# Patient Record
Sex: Female | Born: 1990 | Race: Black or African American | Hispanic: No | Marital: Single | Smoking: Current some day smoker
Health system: Southern US, Community
[De-identification: ages and names within clinical notes are randomized; demographics above are authoritative.]

## PROBLEM LIST (undated history)

## (undated) DIAGNOSIS — A64 Unspecified sexually transmitted disease: Secondary | ICD-10-CM

## (undated) DIAGNOSIS — Z789 Other specified health status: Secondary | ICD-10-CM

## (undated) HISTORY — PX: DILATION AND CURETTAGE OF UTERUS: SHX78

---
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
## 2010-04-02 LAB — POCT PREGNANCY, URINE: Preg Test, Ur: NEGATIVE

## 2010-06-05 ENCOUNTER — Inpatient Hospital Stay (HOSPITAL_COMMUNITY)
Admission: RE | Admit: 2010-06-05 | Discharge: 2010-06-05 | Disposition: A | Discharge status: Home / Self Care | Payer: Managed Care, Other (non HMO) | Source: Ambulatory Visit | Attending: Family Medicine | Admitting: Family Medicine

## 2010-09-29 ENCOUNTER — Emergency Department (HOSPITAL_COMMUNITY)
Admission: EM | Admit: 2010-09-29 | Discharge: 2010-09-30 | Discharge status: Against Medical Advice | Payer: Managed Care, Other (non HMO) | Attending: Emergency Medicine | Admitting: Emergency Medicine

## 2010-09-29 DIAGNOSIS — R110 Nausea: Secondary | ICD-10-CM | POA: Insufficient documentation

## 2010-10-20 LAB — POCT INFECTIOUS MONO SCREEN: Mono Screen: POSITIVE — AB

## 2010-10-20 LAB — POCT RAPID STREP A: Streptococcus, Group A Screen (Direct): NEGATIVE

## 2011-11-19 ENCOUNTER — Emergency Department (HOSPITAL_COMMUNITY)
Admission: EM | Admit: 2011-11-19 | Discharge: 2011-11-19 | Disposition: A | Discharge status: Home / Self Care | Payer: Self-pay | Attending: Emergency Medicine | Admitting: Emergency Medicine

## 2011-11-19 ENCOUNTER — Encounter (HOSPITAL_COMMUNITY): Payer: Self-pay | Admitting: Emergency Medicine

## 2011-11-19 ENCOUNTER — Emergency Department (HOSPITAL_COMMUNITY): Payer: Self-pay

## 2011-11-19 ENCOUNTER — Emergency Department (HOSPITAL_COMMUNITY)
Admission: EM | Admit: 2011-11-19 | Discharge: 2011-11-19 | Discharge status: Against Medical Advice | Payer: Self-pay | Attending: Emergency Medicine | Admitting: Emergency Medicine

## 2011-11-19 DIAGNOSIS — F17200 Nicotine dependence, unspecified, uncomplicated: Secondary | ICD-10-CM | POA: Insufficient documentation

## 2011-11-19 DIAGNOSIS — N390 Urinary tract infection, site not specified: Secondary | ICD-10-CM | POA: Insufficient documentation

## 2011-11-19 DIAGNOSIS — A599 Trichomoniasis, unspecified: Secondary | ICD-10-CM | POA: Insufficient documentation

## 2011-11-19 DIAGNOSIS — M79609 Pain in unspecified limb: Secondary | ICD-10-CM | POA: Insufficient documentation

## 2011-11-19 DIAGNOSIS — N898 Other specified noninflammatory disorders of vagina: Secondary | ICD-10-CM | POA: Insufficient documentation

## 2011-11-19 DIAGNOSIS — M549 Dorsalgia, unspecified: Secondary | ICD-10-CM | POA: Insufficient documentation

## 2011-11-19 LAB — CBC WITH DIFFERENTIAL/PLATELET
Basophils Relative: 0 % (ref 0–1)
Eosinophils Absolute: 0.1 10*3/uL (ref 0.0–0.7)
Lymphocytes Relative: 21 % (ref 12–46)
Lymphs Abs: 1.5 10*3/uL (ref 0.7–4.0)
Neutro Abs: 4.9 10*3/uL (ref 1.7–7.7)
Neutrophils Relative %: 68 % (ref 43–77)
Platelets: 196 10*3/uL (ref 150–400)
RBC: 3.9 MIL/uL (ref 3.87–5.11)
RDW: 12.7 % (ref 11.5–15.5)
WBC: 7.2 10*3/uL (ref 4.0–10.5)

## 2011-11-19 LAB — URINE MICROSCOPIC-ADD ON

## 2011-11-19 LAB — COMPREHENSIVE METABOLIC PANEL
Alkaline Phosphatase: 47 U/L (ref 39–117)
BUN: 7 mg/dL (ref 6–23)
Chloride: 101 mEq/L (ref 96–112)
Creatinine, Ser: 0.69 mg/dL (ref 0.50–1.10)
GFR calc Af Amer: >90 mL/min (ref >90)
GFR calc non Af Amer: >90 mL/min (ref >90)
Glucose, Bld: 89 mg/dL (ref 70–99)
Potassium: 4 mEq/L (ref 3.5–5.1)
Total Bilirubin: 0.3 mg/dL (ref 0.3–1.2)

## 2011-11-19 LAB — URINALYSIS, ROUTINE W REFLEX MICROSCOPIC
Bilirubin Urine: NEGATIVE
Glucose, UA: NEGATIVE mg/dL
Ketones, ur: NEGATIVE mg/dL
Nitrite: NEGATIVE
Protein, ur: NEGATIVE mg/dL
pH: 7.5 (ref 5.0–8.0)

## 2011-11-19 MED ORDER — SULFAMETHOXAZOLE-TMP DS 800-160 MG PO TABS
1.0000 | ORAL_TABLET | Freq: Once | ORAL | Status: AC
  Administered 2011-11-19: 1 via ORAL
  Filled 2011-11-19: qty 1

## 2011-11-19 MED ORDER — METRONIDAZOLE 500 MG PO TABS: 500.0000 mg | ORAL_TABLET | Freq: Two times a day (BID) | ORAL | Status: DC

## 2011-11-19 MED ORDER — IOHEXOL 300 MG/ML  SOLN
100.0000 mL | Freq: Once | INTRAMUSCULAR | Status: AC | PRN
  Administered 2011-11-19: 100 mL via INTRAVENOUS

## 2011-11-19 MED ORDER — METRONIDAZOLE 500 MG PO TABS
500.0000 mg | ORAL_TABLET | Freq: Once | ORAL | Status: AC
  Administered 2011-11-19: 500 mg via ORAL
  Filled 2011-11-19: qty 1

## 2011-11-19 MED ORDER — SULFAMETHOXAZOLE-TRIMETHOPRIM 800-160 MG PO TABS: 1.0000 | ORAL_TABLET | Freq: Two times a day (BID) | ORAL | Status: DC

## 2011-11-19 NOTE — ED Provider Notes (Signed)
History     CSN: [id number]  Arrival date & time 11/19/11  4098   First MD Initiated Contact with Patient 11/19/11 2123      Chief Complaint  Patient presents with  . Flank Pain    (Consider location/radiation/quality/duration/timing/severity/associated sxs/prior treatment) Patient is a 21 y.o. female presenting with flank pain. The history is provided by the patient.  Flank Pain This is a new problem. Episode onset: 4 days ago. The problem occurs constantly. The problem has not changed since onset.Associated symptoms include abdominal pain. Pertinent negatives include no chest pain and no shortness of breath. Associated symptoms comments: Pt states she was running and tripped over a stump that caused her to fall and hit her left abd on the tree stump.  Since that time she has had ongoing pain.. The symptoms are aggravated by walking, bending and twisting. Nothing relieves the symptoms. She has tried acetaminophen for the symptoms. The treatment provided no relief.    History reviewed. No pertinent past medical history.  History reviewed. No pertinent past surgical history.  No family history on file.  History  Substance Use Topics  . Smoking status: Current Every Day Smoker  . Smokeless tobacco: Not on file  . Alcohol Use: Yes    OB History    Grav Para Term Preterm Abortions TAB SAB Ect Mult Living                  Review of Systems  Respiratory: Negative for shortness of breath.   Cardiovascular: Negative for chest pain.  Gastrointestinal: Positive for abdominal pain. Negative for nausea, vomiting and diarrhea.  Genitourinary: Positive for flank pain and vaginal bleeding. Negative for dysuria and vaginal discharge.       Mild vaginal spotting starting yesterday  All other systems reviewed and are negative.    Allergies  Review of patient's allergies indicates no known allergies.  Home Medications  No current outpatient prescriptions on file.  BP 117/93   Pulse 71  Temp 98.8 F (37.1 C) (Oral)  SpO2 100%  LMP 11/05/2011  Physical Exam  Nursing note and vitals reviewed. Constitutional: She is oriented to person, place, and time. She appears well-developed and well-nourished. No distress.  HENT:  Head: Normocephalic and atraumatic.  Mouth/Throat: Oropharynx is clear and moist.  Eyes: Conjunctivae normal and EOM are normal. Pupils are equal, round, and reactive to light.  Neck: Normal range of motion. Neck supple.  Cardiovascular: Normal rate, regular rhythm and intact distal pulses.   No murmur heard. Pulmonary/Chest: Effort normal and breath sounds normal. No respiratory distress. She has no wheezes. She has no rales.  Abdominal: Soft. Normal appearance. She exhibits no distension. There is tenderness in the left upper quadrant and left lower quadrant. There is guarding and CVA tenderness. There is no rebound.  Musculoskeletal: Normal range of motion. She exhibits no edema and no tenderness.  Neurological: She is alert and oriented to person, place, and time.  Skin: Skin is warm and dry. No rash noted. No erythema.  Psychiatric: She has a normal mood and affect. Her behavior is normal.    ED Course  Procedures (including critical care time)  Labs Reviewed  URINALYSIS, ROUTINE W REFLEX MICROSCOPIC - Abnormal; Notable for the following:    APPearance CLOUDY (*)     Hgb urine dipstick SMALL (*)     Leukocytes, UA LARGE (*)     All other components within normal limits  URINE MICROSCOPIC-ADD ON - Abnormal; Notable for the  following:    Squamous Epithelial / LPF FEW (*)     Bacteria, UA FEW (*)     All other components within normal limits  PREGNANCY, URINE  COMPREHENSIVE METABOLIC PANEL  CBC WITH DIFFERENTIAL  URINE CULTURE   Ct Abdomen Pelvis W Contrast  11/19/2011  *RADIOLOGY REPORT*  Clinical Data: Left lower back pain and abdominal pain. Recent abdominal injury; assess for splenic laceration.  CT ABDOMEN AND PELVIS WITH  CONTRAST  Technique:  Multidetector CT imaging of the abdomen and pelvis was performed following the standard protocol during bolus administration of intravenous contrast.  Contrast: OMNIPAQUE IOHEXOL 300 MG/ML  SOLN  Comparison: None.  Findings: The visualized lung bases are clear.  The liver and spleen are unremarkable in appearance.  The gallbladder is within normal limits.  The pancreas and adrenal glands are unremarkable.  The kidneys are unremarkable in appearance.  There is no evidence of hydronephrosis.  No renal or ureteral stones are seen.  No perinephric stranding is appreciated.  No free fluid is identified.  The small bowel is unremarkable in appearance.  The stomach is within normal limits.  No acute vascular abnormalities are seen.  The appendix is not well characterized, but there is no evidence for appendicitis.  The colon is unremarkable in appearance.  The bladder is mildly distended and grossly unremarkable in appearance.  The uterus is grossly normal in appearance.  The ovaries are relatively symmetric, though slightly prominent; no suspicious adnexal masses are seen.  A small amount of free fluid within the pelvic cul-de-sac is likely physiologic in nature.  No inguinal lymphadenopathy is seen.  No acute osseous abnormalities are identified.  IMPRESSION: Unremarkable contrast CT of the abdomen and pelvis.   Original Report Authenticated By: Tonia Ghent, M.D.      No diagnosis found.    MDM   The patient states that 4 days ago she was running and tripped over a tree stump and inhale her left side. Since that time she's had severe left upper and lower quadrant tenderness. She denies any dysuria but states she did start having spotting vaginally yesterday. She denies any vaginal discharge or other complaints. She states she is sexually active. Last normal period was 3 weeks ago. On exam patient has significant left upper, left flank and left lower quadrant tenderness. She has  normal blood pressure and denies dizziness, weakness, vomiting, change in bowel movements. Concern for possible injury due to fall in hitting her abdomen. Will get a CT to rule out bleed. UA with trace blood however she states she is bleeding vaginally and this was a clean-catch. Also shown to have Trichomonas in her urine. This will be to be treated. CMP and CBC pending.  10:56 PM Labs wnl except for urine.  CT neg.  Will treat trichomonas with flagyl.  Pt states she has been in a monogomous relationship with a female for the last year and denies any vaginal d/c or pain. UTI treated with bactrim.      Gwyneth Sprout, MD 11/19/11 2314

## 2011-11-19 NOTE — ED Notes (Signed)
Pt is c/o left lower back and abdomen pain starting last Friday. Pt has no c/o fever, nausea or vomiting or dysuria. Pt is resting comfortably at this time and is in no obvious distress.

## 2011-11-19 NOTE — ED Notes (Signed)
Pt c/o left sided lower back pain into hip and down left leg after falling 3 days ago

## 2011-11-19 NOTE — ED Notes (Signed)
Pt states that she has been having L flank pain since Friday. Sexually active. States she has been having vaginal spotting of blood. Period was two weeks ago.

## 2011-11-19 NOTE — ED Notes (Signed)
No answer x 3 per NT and Nurse First 

## 2011-11-21 LAB — URINE CULTURE

## 2012-02-09 ENCOUNTER — Encounter (HOSPITAL_COMMUNITY): Payer: Self-pay | Admitting: *Deleted

## 2012-02-09 ENCOUNTER — Emergency Department (HOSPITAL_COMMUNITY)
Admission: EM | Admit: 2012-02-09 | Discharge: 2012-02-09 | Disposition: A | Discharge status: Home / Self Care | Payer: Self-pay | Attending: Emergency Medicine | Admitting: Emergency Medicine

## 2012-02-09 DIAGNOSIS — R05 Cough: Secondary | ICD-10-CM | POA: Insufficient documentation

## 2012-02-09 DIAGNOSIS — J029 Acute pharyngitis, unspecified: Secondary | ICD-10-CM | POA: Insufficient documentation

## 2012-02-09 DIAGNOSIS — R51 Headache: Secondary | ICD-10-CM | POA: Insufficient documentation

## 2012-02-09 DIAGNOSIS — R0989 Other specified symptoms and signs involving the circulatory and respiratory systems: Secondary | ICD-10-CM | POA: Insufficient documentation

## 2012-02-09 DIAGNOSIS — R059 Cough, unspecified: Secondary | ICD-10-CM | POA: Insufficient documentation

## 2012-02-09 DIAGNOSIS — J3489 Other specified disorders of nose and nasal sinuses: Secondary | ICD-10-CM | POA: Insufficient documentation

## 2012-02-09 DIAGNOSIS — R0609 Other forms of dyspnea: Secondary | ICD-10-CM | POA: Insufficient documentation

## 2012-02-09 DIAGNOSIS — F17200 Nicotine dependence, unspecified, uncomplicated: Secondary | ICD-10-CM | POA: Insufficient documentation

## 2012-02-09 DIAGNOSIS — R6889 Other general symptoms and signs: Secondary | ICD-10-CM

## 2012-02-09 MED ORDER — HYDROCODONE-HOMATROPINE 5-1.5 MG/5ML PO SYRP: 5.0000 mL | ORAL_SOLUTION | Freq: Four times a day (QID) | ORAL | Status: DC | PRN

## 2012-02-09 MED ORDER — IBUPROFEN 400 MG PO TABS
800.0000 mg | ORAL_TABLET | Freq: Once | ORAL | Status: AC
  Administered 2012-02-09: 800 mg via ORAL
  Filled 2012-02-09: qty 2

## 2012-02-09 MED ORDER — NAPROXEN 500 MG PO TABS: 500.0000 mg | ORAL_TABLET | Freq: Two times a day (BID) | ORAL | Status: DC

## 2012-02-09 MED ORDER — MOMETASONE FUROATE 50 MCG/ACT NA SUSP: 2.0000 | Freq: Every day | NASAL | Status: DC

## 2012-02-09 MED ORDER — HYDROCOD POLST-CHLORPHEN POLST 10-8 MG/5ML PO LQCR
5.0000 mL | Freq: Once | ORAL | Status: AC
  Administered 2012-02-09: 5 mL via ORAL
  Filled 2012-02-09: qty 5

## 2012-02-09 NOTE — ED Provider Notes (Signed)
History   This chart was scribed for non-physician practitioner working with Derwood Kaplan, MD by Leone Payor, ED Scribe. This patient was seen in room TR08C/TR08C and the patient's care was started at 1802.   CSN: [id number]  Arrival date & time 02/09/12  1802   First MD Initiated Contact with Patient 02/09/12 1829      Chief Complaint  Patient presents with  . Influenza     The history is provided by the patient. No language interpreter was used.    Kristina Day is a 22 y.o. female who presents to the Emergency Department complaining of new, constant, unchanged nasal congestion with associated facial pain, trouble breathing, cough, sore throat. Pt reports taking dayquil with mild relief but denies taking any decongestants. Pt denies tinnitus.    Pt is a current everyday smoker (2 cigarette/day) and occasional alcohol user.  History reviewed. No pertinent past medical history.  History reviewed. No pertinent past surgical history.  No family history on file.  History  Substance Use Topics  . Smoking status: Current Every Day Smoker  . Smokeless tobacco: Not on file  . Alcohol Use: Yes    No OB history provided.   Review of Systems  A complete 10 system review of systems was obtained and all systems are negative except as noted in the HPI and PMH.   Allergies  Review of patient's allergies indicates no known allergies.  Home Medications   Current Outpatient Rx  Name  Route  Sig  Dispense  Refill  . VICKS DAYQUIL/NYQUIL CLD & FLU PO   Oral   Take 1 capsule by mouth daily as needed. For cold and flu symptoms           BP 119/78  Pulse 85  Temp 98.3 F (36.8 C) (Oral)  Resp 16  SpO2 99%  Physical Exam  Nursing note and vitals reviewed. Constitutional: She is oriented to person, place, and time. She appears well-developed and well-nourished. No distress.  HENT:  Head: Normocephalic and atraumatic.  Mouth/Throat: Oropharynx is clear and moist. No  oropharyngeal exudate.       Mucous membranes moist and clear.   No tenderness to palpation over frontal or maxillary sinuses.   Eyes: Conjunctivae normal and EOM are normal.  Neck: Normal range of motion.  Cardiovascular: Normal rate, regular rhythm and normal heart sounds.   Pulmonary/Chest: Effort normal and breath sounds normal. No respiratory distress. She has no wheezes. She has no rales.       Lungs clear to auscultation bilaterally.   Musculoskeletal: Normal range of motion.  Lymphadenopathy:    She has no cervical adenopathy.  Neurological: She is alert and oriented to person, place, and time.  Skin: Skin is warm and dry. No rash noted. She is not diaphoretic.  Psychiatric: She has a normal mood and affect. Her behavior is normal.    ED Course  Procedures (including critical care time)  DIAGNOSTIC STUDIES: Oxygen Saturation is 99% on room air, normal by my interpretation.    COORDINATION OF CARE:  6:42 PM Will treat symptomatically in ER with prescriptions cough syrup and nasal spray. Pt advised to return to ED If symptoms worsen. Pt will be given school note for 4 days.   Labs Reviewed - No data to display No results found.   No diagnosis found.    MDM  Flu like s/s  Patients symptoms are consistent with URI/flu like symptoms, likely viral etiology. Discussed that antibiotics are not  indicated for viral infections. Pt will be discharged with symptomatic treatment.  Verbalizes understanding and is agreeable with plan. Pt is hemodynamically stable & in NAD prior to dc.      I personally performed the services described in this documentation, which was scribed in my presence. The recorded information has been reviewed and is accurate.       Jaci Carrel, [place] 02/09/12 1939

## 2012-02-09 NOTE — ED Notes (Signed)
Pt woke up this am with body aches, congestion, chills.  No abdominal pain or other symptoms.

## 2012-02-10 NOTE — ED Provider Notes (Signed)
Medical screening examination/treatment/procedure(s) were performed by non-physician practitioner and as supervising physician I was immediately available for consultation/collaboration.  Merwin Breden, MD 02/10/12 1605 

## 2012-06-08 ENCOUNTER — Emergency Department (HOSPITAL_COMMUNITY)
Admission: EM | Admit: 2012-06-08 | Discharge: 2012-06-09 | Disposition: A | Discharge status: Home / Self Care | Payer: Self-pay | Attending: Emergency Medicine | Admitting: Emergency Medicine

## 2012-06-08 DIAGNOSIS — K529 Noninfective gastroenteritis and colitis, unspecified: Secondary | ICD-10-CM

## 2012-06-08 DIAGNOSIS — Z3202 Encounter for pregnancy test, result negative: Secondary | ICD-10-CM | POA: Insufficient documentation

## 2012-06-08 DIAGNOSIS — K5289 Other specified noninfective gastroenteritis and colitis: Secondary | ICD-10-CM | POA: Insufficient documentation

## 2012-06-08 DIAGNOSIS — R509 Fever, unspecified: Secondary | ICD-10-CM

## 2012-06-08 DIAGNOSIS — F17200 Nicotine dependence, unspecified, uncomplicated: Secondary | ICD-10-CM | POA: Insufficient documentation

## 2012-06-08 LAB — COMPREHENSIVE METABOLIC PANEL
ALT: 9 U/L (ref 0–35)
Calcium: 9.4 mg/dL (ref 8.4–10.5)
Creatinine, Ser: 0.75 mg/dL (ref 0.50–1.10)
GFR calc Af Amer: >90 mL/min (ref >90)
Glucose, Bld: 96 mg/dL (ref 70–99)
Sodium: 134 mEq/L — ABNORMAL LOW (ref 135–145)
Total Protein: 7.6 g/dL (ref 6.0–8.3)

## 2012-06-08 LAB — CBC
MCH: 32.6 pg (ref 26.0–34.0)
MCHC: 35.4 g/dL (ref 30.0–36.0)
MCV: 92.1 fL (ref 78.0–100.0)
Platelets: 202 10*3/uL (ref 150–400)

## 2012-06-08 LAB — URINALYSIS, ROUTINE W REFLEX MICROSCOPIC
Nitrite: NEGATIVE
Specific Gravity, Urine: 1.025 (ref 1.005–1.030)
pH: 7.5 (ref 5.0–8.0)

## 2012-06-08 LAB — URINE MICROSCOPIC-ADD ON

## 2012-06-08 LAB — POCT PREGNANCY, URINE: Preg Test, Ur: NEGATIVE

## 2012-06-08 MED ORDER — ACETAMINOPHEN 325 MG PO TABS
650.0000 mg | ORAL_TABLET | Freq: Once | ORAL | Status: AC
  Administered 2012-06-08: 650 mg via ORAL
  Filled 2012-06-08 (×2): qty 1

## 2012-06-08 MED ORDER — SODIUM CHLORIDE 0.9 % IV BOLUS (SEPSIS)
2000.0000 mL | Freq: Once | INTRAVENOUS | Status: AC
  Administered 2012-06-08: 2000 mL via INTRAVENOUS

## 2012-06-08 MED ORDER — IOHEXOL 300 MG/ML  SOLN
50.0000 mL | Freq: Once | INTRAMUSCULAR | Status: AC | PRN
  Administered 2012-06-08: 50 mL via ORAL

## 2012-06-08 NOTE — ED Notes (Signed)
Pt c/o fever, chills, weakness, generalized body aches, and HA. Denies N/V/D.

## 2012-06-08 NOTE — ED Notes (Signed)
Family at bedside. 

## 2012-06-08 NOTE — ED Notes (Addendum)
NURSE FIRST ROUNDS : NURSE EXPLAINED DELAY AND PROCESS , FEVER SUBSIDED , DENIES PAIN / RESPIRATIONS UNLABORED , AMBULATORY . PT. STATED SHE IS FEELING BETTER.  PT. UNABLE TO GIVE URINE SPECIMEN AT THIS TIME.

## 2012-06-08 NOTE — ED Provider Notes (Signed)
History     CSN: [id number]  Arrival date & time 06/08/12  1929   First MD Initiated Contact with Patient 06/08/12 2224      Chief Complaint  Patient presents with  . Fever    (Consider location/radiation/quality/duration/timing/severity/associated sxs/prior treatment) HPI Patient presents to the emergency department with fevers, bodyaches for the last 2 days.  Patient, states, that she's not had any chest pain, shortness of breath, cough, nausea, vomiting, diarrhea, blurred vision, sore throat, runny nose, dizziness, syncope,vaginal discharge, rash or neck pain patient, states, that she has had abdominal pain, that is mostly mid abdominal pain.  Patient, states she is currently on her menstrual cycle.  Patient, states she did not take anything prior to arrival for her symptoms.  Patient, states, that palpation makes her abdominal pain, worse. No past medical history on file.  No past surgical history on file.  No family history on file.  History  Substance Use Topics  . Smoking status: Current Every Day Smoker  . Smokeless tobacco: Not on file  . Alcohol Use: Yes    OB History   Grav Para Term Preterm Abortions TAB SAB Ect Mult Living                  Review of Systems All other systems negative except as documented in the HPI. All pertinent positives and negatives as reviewed in the HPI. Allergies  Review of patient's allergies indicates no known allergies.  Home Medications  No current outpatient prescriptions on file.  BP 125/69  Pulse 81  Temp(Src) 99.4 F (37.4 C) (Oral)  Resp 20  SpO2 100%  Physical Exam  Nursing note and vitals reviewed. Constitutional: She is oriented to person, place, and time. She appears well-developed and well-nourished. No distress.  HENT:  Head: Normocephalic and atraumatic.  Mouth/Throat: Oropharynx is clear and moist.  Eyes: Conjunctivae and EOM are normal. Pupils are equal, round, and reactive to light.  Neck: Normal range  of motion. Neck supple. No rigidity.  Cardiovascular: Normal rate and regular rhythm.  Exam reveals no gallop and no friction rub.   No murmur heard. Pulmonary/Chest: Effort normal and breath sounds normal. No respiratory distress. She has no wheezes. She has no rales.  Abdominal: Soft. Bowel sounds are normal. She exhibits no distension, no fluid wave and no ascites. There is no rigidity, no rebound, no guarding and no CVA tenderness. No hernia.    Lymphadenopathy:    She has no cervical adenopathy.  Neurological: She is alert and oriented to person, place, and time. She exhibits normal muscle tone. Coordination normal.  Skin: Skin is warm and dry. No rash noted. No erythema.    ED Course  Procedures (including critical care time)  Labs Reviewed  CBC - Abnormal; Notable for the following:    WBC 11.2 (*)    All other components within normal limits  COMPREHENSIVE METABOLIC PANEL - Abnormal; Notable for the following:    Sodium 134 (*)    All other components within normal limits  URINALYSIS, ROUTINE W REFLEX MICROSCOPIC  POCT PREGNANCY, URINE   Patient is being evaluated for possible sources of infection.  Patient does not have any cough or URI symptoms.patient denies any dysuria CT scan will be obtained due the fact she has abdominal pain, with fever. patient does not have any pelvic or lower abdominal pain. patient also denies any neck rigidity.patient has findings that may be a mild colitis.  We'll treat the patient with antibiotics. will  have the patient return here as needed.   MDM  MDM Reviewed: nursing note and vitals Interpretation: labs and CT scan           Carlyle Dolly, PA-C 06/09/12 0139

## 2012-06-08 NOTE — ED Notes (Signed)
Patient is resting comfortably. 

## 2012-06-08 NOTE — ED Notes (Signed)
Patient presents with c/o fever, generalized weakness and fatigue x 1 day. Also endorses sweats and chills. No cough, congestion, SOB, CP. No recent illness. No known sick contacts.

## 2012-06-09 ENCOUNTER — Emergency Department (HOSPITAL_COMMUNITY): Payer: Self-pay

## 2012-06-09 MED ORDER — HYDROCODONE-ACETAMINOPHEN 5-325 MG PO TABS: 1.0000 | ORAL_TABLET | Freq: Four times a day (QID) | ORAL | Status: DC | PRN

## 2012-06-09 MED ORDER — KETOROLAC TROMETHAMINE 30 MG/ML IJ SOLN
30.0000 mg | Freq: Once | INTRAMUSCULAR | Status: AC
  Administered 2012-06-09: 30 mg via INTRAVENOUS
  Filled 2012-06-09: qty 1

## 2012-06-09 MED ORDER — METRONIDAZOLE 500 MG PO TABS: 500.0000 mg | ORAL_TABLET | Freq: Two times a day (BID) | ORAL | Status: DC

## 2012-06-09 MED ORDER — CIPROFLOXACIN HCL 500 MG PO TABS: 500.0000 mg | ORAL_TABLET | Freq: Two times a day (BID) | ORAL | Status: DC

## 2012-06-09 MED ORDER — IOHEXOL 300 MG/ML  SOLN
100.0000 mL | Freq: Once | INTRAMUSCULAR | Status: AC | PRN
  Administered 2012-06-09: 100 mL via INTRAVENOUS

## 2012-06-09 NOTE — ED Notes (Signed)
Pt ambulatory to restroom. Fever decraesed 99.8. Second liter bolus infusing. Pt drinking contrast for CT.

## 2012-06-10 LAB — URINE CULTURE: Colony Count: 70000

## 2012-06-10 NOTE — ED Provider Notes (Signed)
Medical screening examination/treatment/procedure(s) were conducted as a shared visit with non-physician practitioner(s) and myself.  I personally evaluated the patient during the encounter.  No acute abdomen.  CT scan reveals mild colitis.  We'll treat as outpatient  Donnetta Hutching, MD 06/10/12 0111

## 2012-06-11 NOTE — ED Notes (Signed)
Post ED Visit - Positive Culture Follow-up: Successful Patient Follow-Up  Culture assessed and recommendations reviewed by: []  Wes Dulaney, Pharm.D., BCPS []  Celedonio Miyamoto, [address].D., BCPS []  Georgina Pillion, Pharm.D., BCPS [x]  [place], [place].D., BCPS, AAHIVP []  Estella Husk, Pharm.D., BCPS, AAHIVP  Positive urine culture  []  Patient discharged without antimicrobial prescription and treatment is now indicated [x]  Organism is resistant to prescribed ED discharge antimicrobial []  Patient with positive blood cultures  Changes discussed with ED provider: Tatayana New antibiotic prescription- Amoxicillin 500 mg TID x 7 days Called to [facility] on [phone]  Contacted patient- no answer, date 5/28, time 1628   Larena Sox 06/11/2012, 4:25 PM

## 2012-06-12 NOTE — Progress Notes (Signed)
ED Antimicrobial Stewardship Positive Culture Follow Up   Kristina Day is an 22 y.o. female who presented to [facility] on 06/08/2012 with a chief complaint of  Chief Complaint  Patient presents with  . Fever    Recent Results (from the past 720 hour(s))  URINE CULTURE     Status: None   Collection Time    06/08/12  9:55 PM      Result Value Range Status   Specimen Description URINE, CLEAN CATCH   Final   Special Requests ADDED 2240   Final   Culture  Setup Time 06/08/2012 22:46   Final   Colony Count 70,000 COLONIES/ML   Final   Culture GROUP A STREP (S.PYOGENES) ISOLATED   Final   Report Status 06/10/2012 FINAL   Final    [x]  Treated with Cipro, organism resistant to prescribed antimicrobial []  Patient discharged originally without antimicrobial agent and treatment is now indicated  New antibiotic prescription: Ceftin 500mg  BID x7 days  ED Provider: Priscille Loveless 06/12/2012, 9:22 AM Infectious Diseases Pharmacist Phone# [phone]

## 2013-12-06 ENCOUNTER — Encounter (HOSPITAL_COMMUNITY): Payer: Self-pay | Admitting: Emergency Medicine

## 2013-12-06 ENCOUNTER — Emergency Department (HOSPITAL_COMMUNITY)
Admission: EM | Admit: 2013-12-06 | Discharge: 2013-12-07 | Disposition: A | Discharge status: Home / Self Care | Payer: Managed Care, Other (non HMO) | Attending: Emergency Medicine | Admitting: Emergency Medicine

## 2013-12-06 DIAGNOSIS — Z720 Tobacco use: Secondary | ICD-10-CM | POA: Diagnosis not present

## 2013-12-06 DIAGNOSIS — Z792 Long term (current) use of antibiotics: Secondary | ICD-10-CM | POA: Diagnosis not present

## 2013-12-06 DIAGNOSIS — R059 Cough, unspecified: Secondary | ICD-10-CM

## 2013-12-06 DIAGNOSIS — R05 Cough: Secondary | ICD-10-CM | POA: Diagnosis present

## 2013-12-06 NOTE — ED Notes (Signed)
Pt. reports persistent dry cough onset this evening , denies SOB , no fever or chills.

## 2013-12-07 MED ORDER — DEXTROMETHORPHAN POLISTIREX 30 MG/5ML PO LQCR: 30.0000 mg | ORAL | Status: DC | PRN

## 2013-12-07 MED ORDER — DIPHENHYDRAMINE HCL 25 MG PO TABS: 25.0000 mg | ORAL_TABLET | Freq: Four times a day (QID) | ORAL | Status: DC | PRN

## 2013-12-07 NOTE — Discharge Instructions (Signed)
Take benadryl as needed for congestion. Take delsym as needed for cough. Refer to attached documents for more information.

## 2013-12-07 NOTE — ED Provider Notes (Signed)
CSN: [id number]     Arrival date & time 12/06/13  2333 History   First MD Initiated Contact with Patient 12/06/13 2343     Chief Complaint  Patient presents with  . Cough     (Consider location/radiation/quality/duration/timing/severity/associated sxs/prior Treatment) Patient is a 23 y.o. female presenting with cough. The history is provided by the patient. No language interpreter was used.  Cough Cough characteristics:  Dry Severity:  Moderate Onset quality:  Gradual Duration:  1 week Timing:  Constant Progression:  Unchanged Chronicity:  New Smoker: yes   Context: fumes and upper respiratory infection   Context: not animal exposure, not exposure to allergens, not occupational exposure, not sick contacts, not smoke exposure, not weather changes and not with activity   Relieved by:  Nothing Worsened by:  Nothing tried Ineffective treatments:  None tried Associated symptoms: no chest pain, no chills, no fever and no shortness of breath   Risk factors: recent infection   Risk factors: no chemical exposure and no recent travel     History reviewed. No pertinent past medical history. History reviewed. No pertinent past surgical history. No family history on file. History  Substance Use Topics  . Smoking status: Current Every Day Smoker  . Smokeless tobacco: Not on file  . Alcohol Use: Yes   OB History    No data available     Review of Systems  Constitutional: Negative for fever, chills and fatigue.  HENT: Negative for trouble swallowing.   Eyes: Negative for visual disturbance.  Respiratory: Positive for cough. Negative for shortness of breath.   Cardiovascular: Negative for chest pain and palpitations.  Gastrointestinal: Negative for nausea, vomiting, abdominal pain and diarrhea.  Genitourinary: Negative for dysuria and difficulty urinating.  Musculoskeletal: Negative for arthralgias and neck pain.  Skin: Negative for color change.  Neurological: Negative for  dizziness and weakness.  Psychiatric/Behavioral: Negative for dysphoric mood.      Allergies  Review of patient's allergies indicates no known allergies.  Home Medications   Prior to Admission medications   Medication Sig Start Date End Date Taking? Authorizing Provider  ciprofloxacin (CIPRO) 500 MG tablet Take 1 tablet (500 mg total) by mouth 2 (two) times daily. 06/09/12   Carlyle Dollyhristopher W Lawyer, PA-C  HYDROcodone-acetaminophen (NORCO/VICODIN) 5-325 MG per tablet Take 1 tablet by mouth every 6 (six) hours as needed for pain. 06/09/12   Jamesetta Orleanshristopher W Lawyer, PA-C  metroNIDAZOLE (FLAGYL) 500 MG tablet Take 1 tablet (500 mg total) by mouth 2 (two) times daily. 06/09/12   Jamesetta Orleanshristopher W Lawyer, PA-C   BP 120/66 mmHg  Pulse 78  Temp(Src) 97.7 F (36.5 C) (Oral)  Resp 18  Wt 165 lb (74.844 kg)  SpO2 98%  LMP 11/29/2013 Physical Exam  Constitutional: She is oriented to person, place, and time. She appears well-developed and well-nourished. No distress.  HENT:  Head: Normocephalic and atraumatic.  Mouth/Throat: Oropharynx is clear and moist. No oropharyngeal exudate.  Eyes: Conjunctivae and EOM are normal.  Neck: Normal range of motion.  Cardiovascular: Normal rate and regular rhythm.  Exam reveals no gallop and no friction rub.   No murmur heard. Pulmonary/Chest: Effort normal and breath sounds normal. She has no wheezes. She has no rales. She exhibits no tenderness.  Abdominal: Soft. She exhibits no distension. There is no tenderness. There is no rebound.  Musculoskeletal: Normal range of motion.  Neurological: She is alert and oriented to person, place, and time. Coordination normal.  Speech is goal-oriented. Moves limbs without ataxia.  Skin: Skin is warm and dry.  Psychiatric: She has a normal mood and affect. Her behavior is normal.  Nursing note and vitals reviewed.   ED Course  Procedures (including critical care time) Labs Review Labs Reviewed - No data to  display  Imaging Review No results found.   EKG Interpretation None      MDM   Final diagnoses:  Cough    12:04 AM Vitals stable and patient afebrile. Patient's lungs are clear. Patient likely has post viral cough. Patient will have delsym and benadryl. PERC negative.    Emilia BeckKaitlyn Byrd Rushlow, PA-C 12/07/13 0015  Elwin MochaBlair Walden, MD 12/08/13 [phone]

## 2014-04-21 ENCOUNTER — Emergency Department (HOSPITAL_COMMUNITY): Payer: Managed Care, Other (non HMO)

## 2014-04-21 ENCOUNTER — Encounter (HOSPITAL_COMMUNITY): Payer: Self-pay | Admitting: *Deleted

## 2014-04-21 ENCOUNTER — Emergency Department (HOSPITAL_COMMUNITY)
Admission: EM | Admit: 2014-04-21 | Discharge: 2014-04-21 | Disposition: A | Discharge status: Home / Self Care | Payer: Self-pay | Attending: Emergency Medicine | Admitting: Emergency Medicine

## 2014-04-21 DIAGNOSIS — Y998 Other external cause status: Secondary | ICD-10-CM | POA: Insufficient documentation

## 2014-04-21 DIAGNOSIS — H1131 Conjunctival hemorrhage, right eye: Secondary | ICD-10-CM

## 2014-04-21 DIAGNOSIS — Y9389 Activity, other specified: Secondary | ICD-10-CM | POA: Insufficient documentation

## 2014-04-21 DIAGNOSIS — Z792 Long term (current) use of antibiotics: Secondary | ICD-10-CM | POA: Insufficient documentation

## 2014-04-21 DIAGNOSIS — Z720 Tobacco use: Secondary | ICD-10-CM | POA: Insufficient documentation

## 2014-04-21 DIAGNOSIS — Z79899 Other long term (current) drug therapy: Secondary | ICD-10-CM | POA: Insufficient documentation

## 2014-04-21 DIAGNOSIS — Y92410 Unspecified street and highway as the place of occurrence of the external cause: Secondary | ICD-10-CM | POA: Insufficient documentation

## 2014-04-21 DIAGNOSIS — S0083XA Contusion of other part of head, initial encounter: Secondary | ICD-10-CM | POA: Insufficient documentation

## 2014-04-21 MED ORDER — FLUORESCEIN SODIUM 1 MG OP STRP
1.0000 | ORAL_STRIP | Freq: Once | OPHTHALMIC | Status: AC
  Administered 2014-04-21: 1 via OPHTHALMIC
  Filled 2014-04-21: qty 1

## 2014-04-21 MED ORDER — OXYCODONE-ACETAMINOPHEN 5-325 MG PO TABS: 1.0000 | ORAL_TABLET | Freq: Four times a day (QID) | ORAL | Status: DC | PRN

## 2014-04-21 MED ORDER — NAPROXEN 500 MG PO TABS: 500.0000 mg | ORAL_TABLET | Freq: Two times a day (BID) | ORAL | Status: DC

## 2014-04-21 MED ORDER — OXYCODONE-ACETAMINOPHEN 5-325 MG PO TABS
1.0000 | ORAL_TABLET | Freq: Once | ORAL | Status: AC
  Administered 2014-04-21: 1 via ORAL
  Filled 2014-04-21: qty 1

## 2014-04-21 MED ORDER — PROPARACAINE HCL 0.5 % OP SOLN
1.0000 [drp] | Freq: Once | OPHTHALMIC | Status: AC
  Administered 2014-04-21: 1 [drp] via OPHTHALMIC
  Filled 2014-04-21: qty 15
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
## 2014-04-21 NOTE — Discharge Instructions (Signed)
Please read and follow all provided instructions.  Your diagnoses today include:  1. Facial contusion, initial encounter   2. Subconjunctival hemorrhage, right     Tests performed today include:  CT scan of your face that did not show any serious injury.  Vital signs. See below for your results today.   Medications prescribed:   Percocet (oxycodone/acetaminophen) - narcotic pain medication  DO NOT drive or perform any activities that require you to be awake and alert because this medicine can make you drowsy. BE VERY CAREFUL not to take multiple medicines containing Tylenol (also called acetaminophen). Doing so can lead to an overdose which can damage your liver and cause liver failure and possibly death.   Naproxen - anti-inflammatory pain medication  Do not exceed 500mg  naproxen every 12 hours, take with food  You have been prescribed an anti-inflammatory medication or NSAID. Take with food. Take smallest effective dose for the shortest duration needed for your pain. Stop taking if you experience stomach pain or vomiting.   Take any prescribed medications only as directed.  Home care instructions:  Follow any educational materials contained in this packet.  Follow-up instructions: Please follow-up with your primary care provider in the next 3 days for further evaluation of your symptoms.   Return instructions:  SEEK IMMEDIATE MEDICAL ATTENTION IF:  There is confusion or drowsiness (although children frequently become drowsy after injury).   You cannot awaken the injured person.   You have more than one episode of vomiting.   You notice dizziness or unsteadiness which is getting worse, or inability to walk.   You have convulsions or unconsciousness.   You experience severe, persistent headaches not relieved by Tylenol.  You cannot use arms or legs normally.   There are changes in pupil sizes. (This is the black center in the colored part of the eye)   There is  clear or bloody discharge from the nose or ears.   You have change in speech, vision, swallowing, or understanding.   Localized weakness, numbness, tingling, or change in bowel or bladder control.  You have any other emergent concerns.  Additional Information: You have had a head injury which does not appear to require admission at this time.  Your vital signs today were: BP 116/75 mmHg   Pulse 86   Temp(Src) 98.8 F (37.1 C) (Oral)   Resp 18   SpO2 96%   LMP 03/21/2014 If your blood pressure (BP) was elevated above 135/85 this visit, please have this repeated by your doctor within one month. --------------

## 2014-04-21 NOTE — ED Notes (Signed)
Her vision is blurred and she has redness in her sclera
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
## 2014-10-23 ENCOUNTER — Encounter (HOSPITAL_COMMUNITY): Payer: Self-pay | Admitting: Emergency Medicine

## 2014-10-23 ENCOUNTER — Emergency Department (HOSPITAL_COMMUNITY)
Admission: EM | Admit: 2014-10-23 | Discharge: 2014-10-23 | Disposition: A | Discharge status: Home / Self Care | Payer: Managed Care, Other (non HMO) | Attending: Emergency Medicine | Admitting: Emergency Medicine

## 2014-10-23 ENCOUNTER — Emergency Department (HOSPITAL_COMMUNITY): Payer: Managed Care, Other (non HMO)

## 2014-10-23 DIAGNOSIS — S99922A Unspecified injury of left foot, initial encounter: Secondary | ICD-10-CM | POA: Diagnosis present

## 2014-10-23 DIAGNOSIS — Y998 Other external cause status: Secondary | ICD-10-CM | POA: Insufficient documentation

## 2014-10-23 DIAGNOSIS — T148 Other injury of unspecified body region: Secondary | ICD-10-CM | POA: Diagnosis not present

## 2014-10-23 DIAGNOSIS — Y92410 Unspecified street and highway as the place of occurrence of the external cause: Secondary | ICD-10-CM | POA: Insufficient documentation

## 2014-10-23 DIAGNOSIS — Z720 Tobacco use: Secondary | ICD-10-CM | POA: Diagnosis not present

## 2014-10-23 DIAGNOSIS — S0990XA Unspecified injury of head, initial encounter: Secondary | ICD-10-CM | POA: Insufficient documentation

## 2014-10-23 DIAGNOSIS — T148XXA Other injury of unspecified body region, initial encounter: Secondary | ICD-10-CM

## 2014-10-23 DIAGNOSIS — S299XXA Unspecified injury of thorax, initial encounter: Secondary | ICD-10-CM | POA: Diagnosis not present

## 2014-10-23 DIAGNOSIS — Y9389 Activity, other specified: Secondary | ICD-10-CM | POA: Insufficient documentation

## 2014-10-23 DIAGNOSIS — Z792 Long term (current) use of antibiotics: Secondary | ICD-10-CM | POA: Insufficient documentation

## 2014-10-23 MED ORDER — NAPROXEN 500 MG PO TABS: 500.0000 mg | ORAL_TABLET | Freq: Two times a day (BID) | ORAL | Status: DC

## 2014-10-23 MED ORDER — IBUPROFEN 400 MG PO TABS
800.0000 mg | ORAL_TABLET | Freq: Once | ORAL | Status: AC
  Administered 2014-10-23: 800 mg via ORAL
  Filled 2014-10-23: qty 2
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
## 2014-10-23 NOTE — ED Notes (Signed)
Patient transported to X-ray 

## 2016-07-26 ENCOUNTER — Inpatient Hospital Stay (HOSPITAL_COMMUNITY)
Admission: AD | Admit: 2016-07-26 | Discharge: 2016-07-26 | Discharge status: Against Medical Advice | Payer: Managed Care, Other (non HMO) | Source: Ambulatory Visit | Attending: Obstetrics & Gynecology | Admitting: Obstetrics & Gynecology

## 2016-07-26 DIAGNOSIS — Z5321 Procedure and treatment not carried out due to patient leaving prior to being seen by health care provider: Secondary | ICD-10-CM | POA: Insufficient documentation

## 2016-07-26 DIAGNOSIS — R1030 Lower abdominal pain, unspecified: Secondary | ICD-10-CM | POA: Insufficient documentation

## 2016-07-26 LAB — URINALYSIS, ROUTINE W REFLEX MICROSCOPIC
Bilirubin Urine: NEGATIVE
GLUCOSE, UA: NEGATIVE mg/dL
Hgb urine dipstick: NEGATIVE
Ketones, ur: NEGATIVE mg/dL
Leukocytes, UA: NEGATIVE
Nitrite: NEGATIVE
PH: 8 (ref 5.0–8.0)
Protein, ur: NEGATIVE mg/dL
Specific Gravity, Urine: 1.015 (ref 1.005–1.030)

## 2016-07-26 LAB — POCT PREGNANCY, URINE
PREG TEST UR: NEGATIVE
Preg Test, Ur: NEGATIVE

## 2016-07-26 NOTE — MAU Note (Signed)
Patient called from lobby - no response.

## 2016-07-26 NOTE — MAU Note (Signed)
Pt reports lower abd pain x one week, states pain is unbearable. Denies dysuria.

## 2016-07-26 NOTE — MAU Note (Signed)
Pt. Called from lobby, no response - pt. Not here. (to be discharged from the system).

## 2016-07-26 NOTE — MAU Note (Signed)
Urine in lab 

## 2016-07-26 NOTE — MAU Note (Signed)
Pt. Called from lobby - no response.

## 2016-07-30 ENCOUNTER — Ambulatory Visit (HOSPITAL_COMMUNITY)
Admission: EM | Admit: 2016-07-30 | Discharge: 2016-07-30 | Disposition: A | Discharge status: Home / Self Care | Payer: Managed Care, Other (non HMO) | Attending: Emergency Medicine | Admitting: Emergency Medicine

## 2016-07-30 ENCOUNTER — Encounter (HOSPITAL_COMMUNITY): Payer: Self-pay | Admitting: *Deleted

## 2016-07-30 DIAGNOSIS — Z202 Contact with and (suspected) exposure to infections with a predominantly sexual mode of transmission: Secondary | ICD-10-CM | POA: Insufficient documentation

## 2016-07-30 DIAGNOSIS — Z3202 Encounter for pregnancy test, result negative: Secondary | ICD-10-CM

## 2016-07-30 DIAGNOSIS — R102 Pelvic and perineal pain: Secondary | ICD-10-CM | POA: Insufficient documentation

## 2016-07-30 DIAGNOSIS — F17200 Nicotine dependence, unspecified, uncomplicated: Secondary | ICD-10-CM | POA: Insufficient documentation

## 2016-07-30 DIAGNOSIS — J020 Streptococcal pharyngitis: Secondary | ICD-10-CM | POA: Insufficient documentation

## 2016-07-30 DIAGNOSIS — R109 Unspecified abdominal pain: Secondary | ICD-10-CM

## 2016-07-30 DIAGNOSIS — N949 Unspecified condition associated with female genital organs and menstrual cycle: Secondary | ICD-10-CM

## 2016-07-30 LAB — POCT URINALYSIS DIP (DEVICE)
BILIRUBIN URINE: NEGATIVE
GLUCOSE, UA: NEGATIVE mg/dL
Ketones, ur: NEGATIVE mg/dL
LEUKOCYTES UA: NEGATIVE
Nitrite: NEGATIVE
Protein, ur: NEGATIVE mg/dL
SPECIFIC GRAVITY, URINE: 1.02 (ref 1.005–1.030)
UROBILINOGEN UA: 0.2 mg/dL (ref 0.0–1.0)
pH: 7.5 (ref 5.0–8.0)

## 2016-07-30 LAB — POCT PREGNANCY, URINE: Preg Test, Ur: NEGATIVE

## 2016-07-30 LAB — POCT RAPID STREP A: STREPTOCOCCUS, GROUP A SCREEN (DIRECT): POSITIVE — AB

## 2016-07-30 MED ORDER — METRONIDAZOLE 500 MG PO TABS: 500.0000 mg | ORAL_TABLET | Freq: Two times a day (BID) | ORAL | 0 refills | Status: DC

## 2016-07-30 MED ORDER — CEFTRIAXONE SODIUM 1 G IJ SOLR
1.0000 g | Freq: Once | INTRAMUSCULAR | Status: AC
  Administered 2016-07-30: 1 g via INTRAMUSCULAR

## 2016-07-30 MED ORDER — LIDOCAINE HCL (PF) 1 % IJ SOLN
INTRAMUSCULAR | Status: AC
  Filled 2016-07-30: qty 2

## 2016-07-30 MED ORDER — CEFTRIAXONE SODIUM 1 G IJ SOLR
INTRAMUSCULAR | Status: AC
  Filled 2016-07-30: qty 10

## 2016-07-30 MED ORDER — DOXYCYCLINE HYCLATE 100 MG PO CAPS: 100.0000 mg | ORAL_CAPSULE | Freq: Two times a day (BID) | ORAL | 0 refills | Status: DC

## 2016-07-30 NOTE — Discharge Instructions (Signed)
Your strep test was positive, you have strep throat. The has been treated in clinic with an injection of Rocephin.  You're being tested for gonorrhea, chlamydia, Trichomonas, bacterial vaginosis, and yeast. Has of the tenderness on your exam, you're being started on doxycycline, one tablet twice a day for 2 weeks. Also covering with metronidazole 1 tablet twice a day for a week. Do not take any alcohol while taking this medicine as it'll make you very ill.  I have made a referral to the women's outpatient clinic, they will contact you to schedule an appointment for follow-up care in for women's health needs.

## 2016-07-30 NOTE — ED Triage Notes (Signed)
abd   Pain     1    Week   And   sorethroat  Which    Started  This   Am

## 2016-07-30 NOTE — ED Provider Notes (Signed)
CSN: [id number]     Arrival date & time 07/30/16  1429 History   First MD Initiated Contact with Patient 07/30/16 1455     Chief Complaint  Patient presents with  . Abdominal Pain   (Consider location/radiation/quality/duration/timing/severity/associated sxs/prior Treatment) 26 year old female presents to clinic with a chief complaint of lower abdominal pain ongoing for 1 week, and a secondary complaint of sore throat worsening for 24 hours. She states last time she had abdominal pain like this she was diagnosed with Trichomonas. She is sexually active, more than 1 partner, with intermittent condom use. She denies any vaginal discharge, denies any vaginal itching. Her abdominal pain is lower both bilateral right and left quadrants. No association with food or defecation, no fever, nausea, vomiting, or diarrhea.   The history is provided by the patient.    History reviewed. No pertinent past medical history. History reviewed. No pertinent surgical history. History reviewed. No pertinent family history. Social History  Substance Use Topics  . Smoking status: Current Every Day Smoker  . Smokeless tobacco: Not on file  . Alcohol use Yes   OB History    No data available     Review of Systems  Constitutional: Negative.   HENT: Positive for sore throat. Negative for congestion, sinus pain and sinus pressure.   Respiratory: Negative.   Cardiovascular: Negative.   Gastrointestinal: Positive for abdominal pain. Negative for constipation, diarrhea, nausea and vomiting.  Genitourinary: Positive for pelvic pain. Negative for difficulty urinating, dyspareunia, dysuria, vaginal bleeding and vaginal discharge.  Musculoskeletal: Negative.   Skin: Negative.   Neurological: Negative.     Allergies  Patient has no known allergies.  Home Medications   Prior to Admission medications   Medication Sig Start Date End Date Taking? Authorizing Provider  doxycycline (VIBRAMYCIN) 100 MG capsule  Take 1 capsule (100 mg total) by mouth 2 (two) times daily. 07/30/16   Dorena BodoKennard, Edgel Degnan, NP  metroNIDAZOLE (FLAGYL) 500 MG tablet Take 1 tablet (500 mg total) by mouth 2 (two) times daily. 07/30/16   Dorena BodoKennard, Virdie Penning, NP   Meds Ordered and Administered this Visit   Medications  cefTRIAXone (ROCEPHIN) injection 1 g (1 g Intramuscular Given 07/30/16 1604)    BP 122/80 (BP Location: Right Arm)   Pulse 78   Temp 99.5 F (37.5 C) (Oral)   Resp 18   LMP 07/15/2016  No data found.   Physical Exam  Constitutional: She is oriented to person, place, and time. She appears well-developed and well-nourished. No distress.  HENT:  Head: Normocephalic.  Right Ear: External ear normal.  Left Ear: External ear normal.  Mouth/Throat: Uvula is midline. Posterior oropharyngeal edema and posterior oropharyngeal erythema present. Tonsils are 3+ on the right. Tonsils are 3+ on the left. Tonsillar exudate.  Eyes: Conjunctivae are normal.  Neck: Normal range of motion.  Cardiovascular: Normal rate and regular rhythm.   Pulmonary/Chest: Effort normal and breath sounds normal.  Abdominal: Soft. Normal appearance and bowel sounds are normal. There is tenderness in the right lower quadrant and left lower quadrant. There is no rebound and no CVA tenderness. Hernia confirmed negative in the right inguinal area and confirmed negative in the left inguinal area.  Genitourinary: Uterus normal. Pelvic exam was performed with patient supine. There is no tenderness on the right labia. There is no tenderness on the left labia. Cervix exhibits motion tenderness and discharge. Cervix exhibits no friability. Right adnexum displays no tenderness. Left adnexum displays no tenderness. Vaginal discharge found.  Lymphadenopathy:  She has cervical adenopathy.       Right: No inguinal adenopathy present.       Left: No inguinal adenopathy present.  Neurological: She is alert and oriented to person, place, and time.  Skin: Skin  is warm and dry. Capillary refill takes less than 2 seconds. She is not diaphoretic.  Nursing note and vitals reviewed.   Urgent Care Course     Procedures (including critical care time)  Labs Review Labs Reviewed  POCT URINALYSIS DIP (DEVICE) - Abnormal; Notable for the following:       Result Value   Hgb urine dipstick TRACE (*)    All other components within normal limits  POCT RAPID STREP A - Abnormal; Notable for the following:    Streptococcus, Group A Screen (Direct) POSITIVE (*)    All other components within normal limits  POCT PREGNANCY, URINE  CYTOLOGY, (ORAL, ANAL, URETHRAL) ANCILLARY ONLY  CERVICOVAGINAL ANCILLARY ONLY    Imaging Review No results found.      MDM   1. Strep pharyngitis   2. CMT (cervical motion tenderness)   3. Exposure to STD     RSS (+), will coverer with Rocephin in clinic  Kristina Day is a 27 y.o. female with a 1 week history of abdominal pain.  Differential diagnosis includes but is not limited to the following; physiological, bacterial vaginosis, trichomoniasis, yeast infection, cervicitis, pelvic inflammatory disease,Appendicitis, constipation, IBS, and others.  Pelvic exam positive CMT, with discharge  Lab testing oral cytology obtained, RSS positive, pregnancy test negative, UA negative for markers for UTI, cervicovaginal cytology obtained  Imaging is not indicated at this time  The most likely diagnosis is strep throat, along with a possible STD/PID however further testing for gonorrhea, chlamydia, Trichomonas, BV, and yeast has been done. Patient has been treated with Rocephin, metronidazole, doxycycline, recommend follow up with women's outpatient center for further evaluation, and management of her women's health needs  Patient will be notified of these results in 3-5 business days if positive. Counseling provided on safe sex practices, recommend abstinence, or use of a barrier method for at least 7 days.       Dorena Bodo, NP 07/30/16 [phone]

## 2016-07-31 LAB — CYTOLOGY, (ORAL, ANAL, URETHRAL) ANCILLARY ONLY
Chlamydia: NEGATIVE
NEISSERIA GONORRHEA: NEGATIVE
TRICH (WINDOWPATH): NEGATIVE

## 2016-07-31 LAB — CERVICOVAGINAL ANCILLARY ONLY
BACTERIAL VAGINITIS: NEGATIVE
Candida vaginitis: NEGATIVE
Chlamydia: NEGATIVE
Neisseria Gonorrhea: NEGATIVE
Trichomonas: NEGATIVE

## 2016-08-30 ENCOUNTER — Encounter: Payer: Managed Care, Other (non HMO) | Admitting: Family Medicine

## 2017-10-07 ENCOUNTER — Encounter (HOSPITAL_COMMUNITY): Payer: Self-pay | Admitting: Emergency Medicine

## 2017-10-07 ENCOUNTER — Emergency Department (HOSPITAL_COMMUNITY)
Admission: EM | Admit: 2017-10-07 | Discharge: 2017-10-07 | Disposition: A | Discharge status: Home / Self Care | Payer: Self-pay | Attending: Emergency Medicine | Admitting: Emergency Medicine

## 2017-10-07 DIAGNOSIS — N760 Acute vaginitis: Secondary | ICD-10-CM | POA: Insufficient documentation

## 2017-10-07 DIAGNOSIS — F17210 Nicotine dependence, cigarettes, uncomplicated: Secondary | ICD-10-CM | POA: Insufficient documentation

## 2017-10-07 DIAGNOSIS — B9689 Other specified bacterial agents as the cause of diseases classified elsewhere: Secondary | ICD-10-CM

## 2017-10-07 DIAGNOSIS — R102 Pelvic and perineal pain: Secondary | ICD-10-CM

## 2017-10-07 HISTORY — DX: Unspecified sexually transmitted disease: A64

## 2017-10-07 LAB — COMPREHENSIVE METABOLIC PANEL
ALT: 16 U/L (ref 0–44)
ANION GAP: 9 (ref 5–15)
AST: 14 U/L — ABNORMAL LOW (ref 15–41)
Albumin: 4 g/dL (ref 3.5–5.0)
Alkaline Phosphatase: 41 U/L (ref 38–126)
BUN: 11 mg/dL (ref 6–20)
CHLORIDE: 105 mmol/L (ref 98–111)
CO2: 26 mmol/L (ref 22–32)
CREATININE: 0.9 mg/dL (ref 0.44–1.00)
Calcium: 9.6 mg/dL (ref 8.9–10.3)
GFR calc non Af Amer: >60 mL/min (ref >60)
Glucose, Bld: 112 mg/dL — ABNORMAL HIGH (ref 70–99)
Potassium: 4.4 mmol/L (ref 3.5–5.1)
SODIUM: 140 mmol/L (ref 135–145)
Total Bilirubin: 0.9 mg/dL (ref 0.3–1.2)
Total Protein: 7.2 g/dL (ref 6.5–8.1)

## 2017-10-07 LAB — URINALYSIS, ROUTINE W REFLEX MICROSCOPIC
Bacteria, UA: NONE SEEN
Bilirubin Urine: NEGATIVE
Glucose, UA: NEGATIVE mg/dL
Ketones, ur: NEGATIVE mg/dL
Leukocytes, UA: NEGATIVE
Nitrite: NEGATIVE
PROTEIN: NEGATIVE mg/dL
Specific Gravity, Urine: 1.021 (ref 1.005–1.030)
pH: 6 (ref 5.0–8.0)

## 2017-10-07 LAB — CBC
HEMATOCRIT: 42.1 % (ref 36.0–46.0)
HEMOGLOBIN: 13.7 g/dL (ref 12.0–15.0)
MCH: 31.6 pg (ref 26.0–34.0)
MCHC: 32.5 g/dL (ref 30.0–36.0)
MCV: 97.2 fL (ref 78.0–100.0)
Platelets: 189 10*3/uL (ref 150–400)
RBC: 4.33 MIL/uL (ref 3.87–5.11)
RDW: 11.7 % (ref 11.5–15.5)
WBC: 4 10*3/uL (ref 4.0–10.5)

## 2017-10-07 LAB — I-STAT BETA HCG BLOOD, ED (MC, WL, AP ONLY): I-stat hCG, quantitative: <5 m[IU]/mL (ref <5)

## 2017-10-07 LAB — LIPASE, BLOOD: LIPASE: 35 U/L (ref 11–51)

## 2017-10-07 LAB — WET PREP, GENITAL
Sperm: NONE SEEN
Trich, Wet Prep: NONE SEEN
Yeast Wet Prep HPF POC: NONE SEEN

## 2017-10-07 MED ORDER — METRONIDAZOLE 500 MG PO TABS: 500.0000 mg | ORAL_TABLET | Freq: Two times a day (BID) | ORAL | 0 refills | Status: DC

## 2017-10-07 NOTE — ED Triage Notes (Addendum)
Pt has bilateral lower pelvic pain. LMP 5th of sept. No N/V/D. No vaginal discharge.

## 2017-10-07 NOTE — Discharge Instructions (Addendum)
Please read attached information. If you experience any new or worsening signs or symptoms please return to the emergency room for evaluation. Please follow-up with your primary care provider or specialist as discussed. Please use medication prescribed only as directed and discontinue taking if you have any concerning signs or symptoms.   °

## 2017-10-07 NOTE — ED Provider Notes (Signed)
[facility]ENC[facility] Provider Note   CSN: [id number] Arrival date & time: 10/07/17  1651     History   Chief Complaint Chief Complaint  Patient presents with  . Pelvic Pain    HPI Kristina Day is a 27 y.o. female.  HPI   27 year old female presents today with complaints of pelvic pain.  Patient notes bilateral lower pelvic pain over the last week.  She denies any associated nausea vomiting fever chills, denies any upper abdominal pain, vaginal discharge or bleeding, or diarrhea.  Patient notes symptoms are similar to previous episodes of STD.  She notes she is sexually active with female partners only.  Past Medical History:  Diagnosis Date  . STD (female)     There are no active problems to display for this patient.   History reviewed. No pertinent surgical history.   OB History   None      Home Medications    Prior to Admission medications   Medication Sig Start Date End Date Taking? Authorizing Provider  doxycycline (VIBRAMYCIN) 100 MG capsule Take 1 capsule (100 mg total) by mouth 2 (two) times daily. 07/30/16   Dorena BodoKennard, [place], NP  metroNIDAZOLE (FLAGYL) 500 MG tablet Take 1 tablet (500 mg total) by mouth 2 (two) times daily. 10/07/17   Eyvonne MechanicHedges, Genever Hentges, PA-C    Family History No family history on file.  Social History Social History   Tobacco Use  . Smoking status: Current Every Day Smoker  Substance Use Topics  . Alcohol use: Yes  . Drug use: No     Allergies   Patient has no known allergies.   Review of Systems Review of Systems  All other systems reviewed and are negative.  Physical Exam Updated Vital Signs BP 123/87 (BP Location: Right Arm)   Pulse 80   Temp 98.9 F (37.2 C) (Oral)   Resp 16   LMP 09/18/2017   SpO2 100%   Physical Exam  Constitutional: She is oriented to person, place, and time. She appears well-developed and well-nourished.  HENT:  Head: Normocephalic and atraumatic.  Eyes:  Pupils are equal, round, and reactive to light. Conjunctivae are normal. Right eye exhibits no discharge. Left eye exhibits no discharge. No scleral icterus.  Neck: Normal range of motion. No JVD present. No tracheal deviation present.  Pulmonary/Chest: Effort normal. No stridor.  Genitourinary:  Genitourinary Comments: Small amount of white discharge noted in vaginal vault, no cervical motion, adnexal tenderness or masses  Neurological: She is alert and oriented to person, place, and time. Coordination normal.  Psychiatric: She has a normal mood and affect. Her behavior is normal. Judgment and thought content normal.  Nursing note and vitals reviewed.    ED Treatments / Results  Labs (all labs ordered are listed, but only abnormal results are displayed) Labs Reviewed  WET PREP, GENITAL - Abnormal; Notable for the following components:      Result Value   Clue Cells Wet Prep HPF POC PRESENT (*)    WBC, Wet Prep HPF POC FEW (*)    All other components within normal limits  COMPREHENSIVE METABOLIC PANEL - Abnormal; Notable for the following components:   Glucose, Bld 112 (*)    AST 14 (*)    All other components within normal limits  URINALYSIS, ROUTINE W REFLEX MICROSCOPIC - Abnormal; Notable for the following components:   APPearance HAZY (*)    Hgb urine dipstick SMALL (*)    All other components within normal  limits  LIPASE, BLOOD  CBC  I-STAT BETA HCG BLOOD, ED ([facility], [facility], [facility] ONLY)  GC/CHLAMYDIA PROBE AMP ([place]) NOT AT [facility]    EKG None  Radiology No results found.  Procedures Procedures (including critical care time)  Medications Ordered in ED Medications - No data to display   Initial Impression / Assessment and Plan / ED Course  I have reviewed the triage vital signs and the nursing notes.  Pertinent labs & imaging results that were available during my care of the patient were reviewed by me and considered in my medical decision making (see chart for  details).     Labs: ua, I stat beta hcg, lipase, CMP, CBC  Imaging:  Consults:  Therapeutics:  Discharge Meds: Metronidazole  Assessment/Plan: 27 year old female presents today with complaints of pelvic pain.  Patient notes this is similar to past infections.  Patient has no signs of purulent discharge, she does have white discharge consistent with bacterial vaginosis.  She will be treated with metronidazole given clue cells on wet prep.  No signs of pelvic inflammatory disease, intra-abdominal pathology, or any other life-threatening etiology presently.  Discharged with outpatient OB/GYN follow-up, strict return precautions.  Patient verbalized understanding and agreement to today's plan had no further questions or concerns the time discharge.     Final Clinical Impressions(s) / ED Diagnoses   Final diagnoses:  Pelvic pain in female  Bacterial vaginosis    ED Discharge Orders         Ordered    metroNIDAZOLE (FLAGYL) 500 MG tablet  2 times daily     10/07/17 2000           Eyvonne Mechanic, PA-C 10/07/17 2008    Maia Plan, MD 10/08/17 [phone]

## 2017-10-08 LAB — GC/CHLAMYDIA PROBE AMP (~~LOC~~) NOT AT ARMC
Chlamydia: NEGATIVE
Neisseria Gonorrhea: NEGATIVE

## 2018-01-10 ENCOUNTER — Other Ambulatory Visit: Payer: Self-pay

## 2018-01-10 ENCOUNTER — Emergency Department (HOSPITAL_COMMUNITY): Payer: Self-pay

## 2018-01-10 ENCOUNTER — Encounter (HOSPITAL_COMMUNITY): Payer: Self-pay

## 2018-01-10 ENCOUNTER — Emergency Department (HOSPITAL_COMMUNITY)
Admission: EM | Admit: 2018-01-10 | Discharge: 2018-01-10 | Disposition: A | Discharge status: Home / Self Care | Payer: Self-pay | Attending: Emergency Medicine | Admitting: Emergency Medicine

## 2018-01-10 DIAGNOSIS — B9689 Other specified bacterial agents as the cause of diseases classified elsewhere: Secondary | ICD-10-CM

## 2018-01-10 DIAGNOSIS — O209 Hemorrhage in early pregnancy, unspecified: Secondary | ICD-10-CM

## 2018-01-10 DIAGNOSIS — N760 Acute vaginitis: Secondary | ICD-10-CM

## 2018-01-10 DIAGNOSIS — O468X1 Other antepartum hemorrhage, first trimester: Secondary | ICD-10-CM | POA: Insufficient documentation

## 2018-01-10 DIAGNOSIS — O23599 Infection of other part of genital tract in pregnancy, unspecified trimester: Secondary | ICD-10-CM | POA: Insufficient documentation

## 2018-01-10 DIAGNOSIS — O418X10 Other specified disorders of amniotic fluid and membranes, first trimester, not applicable or unspecified: Secondary | ICD-10-CM | POA: Insufficient documentation

## 2018-01-10 DIAGNOSIS — IMO0001 Reserved for inherently not codable concepts without codable children: Secondary | ICD-10-CM

## 2018-01-10 DIAGNOSIS — N898 Other specified noninflammatory disorders of vagina: Secondary | ICD-10-CM | POA: Insufficient documentation

## 2018-01-10 DIAGNOSIS — O208 Other hemorrhage in early pregnancy: Secondary | ICD-10-CM | POA: Insufficient documentation

## 2018-01-10 DIAGNOSIS — F17210 Nicotine dependence, cigarettes, uncomplicated: Secondary | ICD-10-CM | POA: Insufficient documentation

## 2018-01-10 LAB — BASIC METABOLIC PANEL
ANION GAP: 7 (ref 5–15)
BUN: 10 mg/dL (ref 6–20)
CO2: 23 mmol/L (ref 22–32)
Calcium: 9 mg/dL (ref 8.9–10.3)
Chloride: 107 mmol/L (ref 98–111)
Creatinine, Ser: 0.73 mg/dL (ref 0.44–1.00)
GFR calc Af Amer: >60 mL/min (ref >60)
Glucose, Bld: 98 mg/dL (ref 70–99)
POTASSIUM: 4.3 mmol/L (ref 3.5–5.1)
SODIUM: 137 mmol/L (ref 135–145)

## 2018-01-10 LAB — CBC
HCT: 34.8 % — ABNORMAL LOW (ref 36.0–46.0)
Hemoglobin: 11.6 g/dL — ABNORMAL LOW (ref 12.0–15.0)
MCH: 32.1 pg (ref 26.0–34.0)
MCHC: 33.3 g/dL (ref 30.0–36.0)
MCV: 96.4 fL (ref 80.0–100.0)
NRBC: 0 % (ref 0.0–0.2)
PLATELETS: 201 10*3/uL (ref 150–400)
RBC: 3.61 MIL/uL — AB (ref 3.87–5.11)
RDW: 11.9 % (ref 11.5–15.5)
WBC: 4.7 10*3/uL (ref 4.0–10.5)

## 2018-01-10 LAB — HCG, QUANTITATIVE, PREGNANCY: hCG, Beta Chain, Quant, S: 17038 m[IU]/mL — ABNORMAL HIGH (ref <5)

## 2018-01-10 LAB — WET PREP, GENITAL
Sperm: NONE SEEN
Trich, Wet Prep: NONE SEEN
Yeast Wet Prep HPF POC: NONE SEEN

## 2018-01-10 LAB — TYPE AND SCREEN
ABO/RH(D): A POS
ANTIBODY SCREEN: NEGATIVE

## 2018-01-10 LAB — POC URINE PREG, ED: PREG TEST UR: POSITIVE — AB

## 2018-01-10 LAB — ABO/RH: ABO/RH(D): A POS

## 2018-01-10 MED ORDER — METRONIDAZOLE 500 MG PO TABS: 500.0000 mg | ORAL_TABLET | Freq: Two times a day (BID) | ORAL | 0 refills | Status: AC

## 2018-01-10 NOTE — ED Notes (Signed)
Pt verbalized understanding of discharge paperwork, prescriptions and follow-up care 

## 2018-01-10 NOTE — ED Triage Notes (Signed)
Pt reports she just found out she is pregnant and has been having vaginal bleeding. Reports LMP was beginning of November.

## 2018-01-10 NOTE — ED Notes (Signed)
Patient transported to [facility] 

## 2018-01-10 NOTE — Discharge Instructions (Signed)
Please read the instructions below.  Schedule appointment with me women's clinic in 2 weeks for repeat ultrasound.  Finish your antibiotic (Flagyl/Metronidazole) as prescribed.  Begin taking a prenatal vitamin.   Tylenol is the only safe over-the-counter pain medication take while pregnant.   You will receive a call from the hospital if your test results come back positive. Avoid sexual activity until you know your test results. If your results come back positive, it is important that you inform all of your sexual partners.  [facility] worsening vaginal bleeding, abdominal pain, or new or concerning symptoms.

## 2018-01-10 NOTE — ED Provider Notes (Signed)
[facility] Provider Note   CSN: [id number] Arrival date & time: 01/10/18  1202     History   Chief Complaint Chief Complaint  Patient presents with  . Vaginal Bleeding    HPI [name] is a 27 y.o. female G1P0, presenting to the emergency department with recent positive home pregnancy test about 1 week before Christmas.  Her LMP was the first week of November.  She presents with vaginal bleeding, that waxes and wanes throughout the Day, began a few days ago.  She endorses associated low back aching that is similar to menstrual cramping.  She denies abdominal pain, nausea, vomiting.  Does endorse increased urinary frequency.  States she was pregnant once in the past, however had an elective abortion.  The history is provided by the patient.    Past Medical History:  Diagnosis Date  . STD (female)     There are no active problems to display for this patient.   No past surgical history on file.   OB History    Gravida  1   Para      Term      Preterm      AB      Living        SAB      TAB      Ectopic      Multiple      Live Births               Home Medications    Prior to Admission medications   Medication Sig Start Date End Date Taking? Authorizing Provider  doxycycline (VIBRAMYCIN) 100 MG capsule Take 1 capsule (100 mg total) by mouth 2 (two) times daily. 07/30/16   Dorena BodoKennard, [place], NP  metroNIDAZOLE (FLAGYL) 500 MG tablet Take 1 tablet (500 mg total) by mouth 2 (two) times daily for 7 days. 01/10/18 01/17/18  Etrulia Zarr, [place] N, PA-C    Family History No family history on file.  Social History Social History   Tobacco Use  . Smoking status: Current Every Day Smoker  . Smokeless tobacco: Never Used  Substance Use Topics  . Alcohol use: Yes  . Drug use: No     Allergies   Patient has no known allergies.   Review of Systems Review of Systems  Genitourinary: Positive for frequency  and vaginal bleeding.  All other systems reviewed and are negative.    Physical Exam Updated Vital Signs BP 110/65 (BP Location: Right Arm)   Pulse 91   Temp 98.3 F (36.8 C) (Oral)   Resp 18   LMP 11/20/2017 (Within Days)   SpO2 100%   Physical Exam Vitals signs and nursing note reviewed.  Constitutional:      Appearance: Normal appearance. She is well-developed.  HENT:     Head: Normocephalic and atraumatic.  Eyes:     Conjunctiva/sclera: Conjunctivae normal.  Cardiovascular:     Rate and Rhythm: Normal rate and regular rhythm.  Pulmonary:     Effort: Pulmonary effort is normal.     Breath sounds: Normal breath sounds.  Abdominal:     General: Bowel sounds are normal. There is no distension.     Palpations: Abdomen is soft. There is no mass.     Comments: Patient report some discomfort with palpation of the lower abdomen, however no focal tenderness.  Genitourinary:    Comments: Exam performed with female RN chaperone present.  There is pinkish-red moderately thick discharge present.  There is some discomfort the right adnexa with palpation of the cervix. Skin:    General: Skin is warm.  Neurological:     Mental Status: She is alert.  Psychiatric:        Behavior: Behavior normal.      ED Treatments / Results  Labs (all labs ordered are listed, but only abnormal results are displayed) Labs Reviewed  WET PREP, GENITAL - Abnormal; Notable for the following components:      Result Value   Clue Cells Wet Prep HPF POC PRESENT (*)    WBC, Wet Prep HPF POC MANY (*)    All other components within normal limits  HCG, QUANTITATIVE, PREGNANCY - Abnormal; Notable for the following components:   hCG, Beta Chain, Quant, S 17,038 (*)    All other components within normal limits  CBC - Abnormal; Notable for the following components:   RBC 3.61 (*)    Hemoglobin 11.6 (*)    HCT 34.8 (*)    All other components within normal limits  POC URINE PREG, ED - Abnormal; Notable  for the following components:   Preg Test, Ur POSITIVE (*)    All other components within normal limits  BASIC METABOLIC PANEL  ABO/RH  TYPE AND SCREEN  GC/CHLAMYDIA PROBE AMP ([place]) NOT AT [facility]    EKG None  Radiology US Ob Comp < 14 Wks  Result Date: 01/10/2018 CLINICAL DATA:  LMP 11/19/2017. Assigned EDC of 08/26/2018. Patient is 7 weeks 3 days. Bleeding for 2 days. EXAM: OBSTETRIC <14 WK [place] AND TRANSVAGINAL OB US TECHNIQUE: Both transabdominal and transvaginal ultrasound examinations were performed for complete evaluation of the gestation as well as the maternal uterus, adnexal regions, and pelvic cul-de-sac. Transvaginal technique was performed to assess early pregnancy. COMPARISON:  CT of the abdomen and pelvis on 06/09/2012 FINDINGS: Intrauterine gestational sac: Single Yolk sac:  Visualized. Embryo:  Not Visualized. Cardiac Activity: Heart Rate:   bpm MSD: 13.1 mm   6 w   1 d Subchorionic hemorrhage:  Moderate Maternal uterus/adnexae: Normal appearance of both ovaries. Small amount free pelvic fluid. IMPRESSION: Intrauterine gestational sac is present. Embryo is not yet visualized. Follow-up ultrasound is recommended in 14 or more days to document presence of fetal pole and for dating purposes. Normal appearance of the ovaries. Electronically Signed   By: Norva Pavlov M.D.   On: 01/10/2018 15:58   US Ob Transvaginal  Result Date: 01/10/2018 CLINICAL DATA:  LMP 11/19/2017. Assigned EDC of 08/26/2018. Patient is 7 weeks 3 days. Bleeding for 2 days. EXAM: OBSTETRIC <14 WK [place] AND TRANSVAGINAL OB US TECHNIQUE: Both transabdominal and transvaginal ultrasound examinations were performed for complete evaluation of the gestation as well as the maternal uterus, adnexal regions, and pelvic cul-de-sac. Transvaginal technique was performed to assess early pregnancy. COMPARISON:  CT of the abdomen and pelvis on 06/09/2012 FINDINGS: Intrauterine gestational sac: Single Yolk sac:  Visualized.  Embryo:  Not Visualized. Cardiac Activity: Heart Rate:   bpm MSD: 13.1 mm   6 w   1 d Subchorionic hemorrhage:  Moderate Maternal uterus/adnexae: Normal appearance of both ovaries. Small amount free pelvic fluid. IMPRESSION: Intrauterine gestational sac is present. Embryo is not yet visualized. Follow-up ultrasound is recommended in 14 or more days to document presence of fetal pole and for dating purposes. Normal appearance of the ovaries. Electronically Signed   By: Norva Pavlov M.D.   On: 01/10/2018 15:58    Procedures Procedures (including critical care time)  Medications  Ordered in ED Medications - No data to display   Initial Impression / Assessment and Plan / ED Course  I have reviewed the triage vital signs and the nursing notes.  Pertinent labs & imaging results that were available during my care of the patient were reviewed by me and considered in my medical decision making (see chart for details).    Patient presenting with vaginal bleeding after positive pregnancy test about 1 week ago.  No significant abdominal pain.  LMP was the first week of November.  On exam, abdomen is soft with some discomfort in the lower abdomen however no focal tenderness.  Pelvic exam with some mild tenderness in the right adnexa.  POC urine pregnant is positive.  Follow-up quantitative hCG is 17,038.  Prep with clue cells and many white cells.  CBC with stable hemoglobin.  Blood type is A+, does not require RhoGam.  Ultrasound showing presence of intrauterine gestational sac and yolk sac, however unable to visualize embryo.  Recommends follow-up ultrasound in 14 days for dating and evaluation of presence of fetal pole.  There is a moderate subchorionic hemorrhage present which may be the source of her bleeding.  Discussed this result with patient, and recommendation for close follow-up.  Verbalized understanding and agrees with plan. Discussed flagyl with pharmacist.  Will treat with Flagyl for BV,  discussed risk vs benefit of flagyl in 1st trimester, pt would like to take abx with known hx of bv and discharge.  She understands she has STD cultures pending and is instructed to avoid any sexual activity until she knows her results.  Discussed results, findings, treatment and follow up. Patient advised of return precautions. Patient verbalized understanding and agreed with plan.  Final Clinical Impressions(s) / ED Diagnoses   Final diagnoses:  Subchorionic hemorrhage of placenta in first trimester, single or unspecified fetus  Vaginal bleeding affecting early pregnancy  BV (bacterial vaginosis)    ED Discharge Orders         Ordered    metroNIDAZOLE (FLAGYL) 500 MG tablet  2 times daily     01/10/18 1614           Keddrick Wyne, [place] N, [place]PA-C 01/10/18 1631    Melene PlanFloyd, Dan, DO 01/10/18 1842

## 2018-01-13 LAB — GC/CHLAMYDIA PROBE AMP (~~LOC~~) NOT AT ARMC
CHLAMYDIA, DNA PROBE: NEGATIVE
NEISSERIA GONORRHEA: NEGATIVE

## 2018-03-08 ENCOUNTER — Inpatient Hospital Stay (HOSPITAL_COMMUNITY): Payer: Self-pay

## 2018-03-08 ENCOUNTER — Inpatient Hospital Stay (HOSPITAL_COMMUNITY)
Admission: AD | Admit: 2018-03-08 | Discharge: 2018-03-08 | Disposition: A | Discharge status: Home / Self Care | Payer: Self-pay | Source: Ambulatory Visit | Attending: Obstetrics & Gynecology | Admitting: Obstetrics & Gynecology

## 2018-03-08 ENCOUNTER — Other Ambulatory Visit: Payer: Self-pay

## 2018-03-08 ENCOUNTER — Encounter (HOSPITAL_COMMUNITY): Payer: Self-pay

## 2018-03-08 DIAGNOSIS — O071 Delayed or excessive hemorrhage following failed attempted termination of pregnancy: Secondary | ICD-10-CM | POA: Insufficient documentation

## 2018-03-08 DIAGNOSIS — R102 Pelvic and perineal pain: Secondary | ICD-10-CM

## 2018-03-08 DIAGNOSIS — O209 Hemorrhage in early pregnancy, unspecified: Secondary | ICD-10-CM

## 2018-03-08 HISTORY — DX: Other specified health status: Z78.9

## 2018-03-08 LAB — URINALYSIS, ROUTINE W REFLEX MICROSCOPIC
BILIRUBIN URINE: NEGATIVE
Glucose, UA: NEGATIVE mg/dL
KETONES UR: NEGATIVE mg/dL
Leukocytes,Ua: NEGATIVE
NITRITE: NEGATIVE
Protein, ur: NEGATIVE mg/dL
SPECIFIC GRAVITY, URINE: 1.02 (ref 1.005–1.030)
pH: 7.5 (ref 5.0–8.0)

## 2018-03-08 LAB — CBC WITH DIFFERENTIAL/PLATELET
BASOS PCT: 0 %
Basophils Absolute: 0 10*3/uL (ref 0.0–0.1)
EOS PCT: 2 %
Eosinophils Absolute: 0.1 10*3/uL (ref 0.0–0.5)
HCT: 38.5 % (ref 36.0–46.0)
Hemoglobin: 12.6 g/dL (ref 12.0–15.0)
Lymphocytes Relative: 50 %
Lymphs Abs: 1.7 10*3/uL (ref 0.7–4.0)
MCH: 32 pg (ref 26.0–34.0)
MCHC: 32.7 g/dL (ref 30.0–36.0)
MCV: 97.7 fL (ref 80.0–100.0)
MONO ABS: 0.3 10*3/uL (ref 0.1–1.0)
MONOS PCT: 8 %
NEUTROS ABS: 1.4 10*3/uL — AB (ref 1.7–7.7)
Neutrophils Relative %: 40 %
PLATELETS: 193 10*3/uL (ref 150–400)
RBC: 3.94 MIL/uL (ref 3.87–5.11)
RDW: 12.5 % (ref 11.5–15.5)
WBC: 3.5 10*3/uL — ABNORMAL LOW (ref 4.0–10.5)
nRBC: 0 % (ref 0.0–0.2)

## 2018-03-08 LAB — URINALYSIS, MICROSCOPIC (REFLEX)
Bacteria, UA: NONE SEEN
WBC UA: NONE SEEN WBC/hpf (ref 0–5)

## 2018-03-08 LAB — HCG, QUANTITATIVE, PREGNANCY: HCG, BETA CHAIN, QUANT, S: 21 m[IU]/mL — AB (ref <5)

## 2018-03-08 NOTE — MAU Note (Signed)
Kristina Day is a 28 y.o. here in MAU reporting: reports she had an abortion about 2 months and is still bleeding. States the bleeding is heavier today and is having pain in abdomen and back. Changing a pad 2-3 times per day. States when the doctor was doing the procedure he had to go back in and remove tissue that he may have forgotten so pt is worried about retained products.  Onset of complaint: ongoing  Pain score: back pain 8/10, abdominal pain 9/10  Vitals:   03/08/18 1426  BP: 118/69  Pulse: 73  Resp: 18  Temp: 99 F (37.2 C)      Lab orders placed from triage: UA, will ask provider about UPT

## 2018-03-08 NOTE — MAU Provider Note (Addendum)
History     CSN: [id number]  Arrival date and time: 03/08/18 1412   First Provider Initiated Contact with Patient 03/08/18 1449      Chief Complaint  Patient presents with  . Abdominal Pain  . Back Pain  . Vaginal Bleeding   Kristina Day is a 28 y.o. G2P0020 s/p TAB 8 weeks ago who presents to MAU today with complaint of bleeding. She states that she had been bleeding since the TAB, bleeding had been lighter recently until today. She has had lower abdominal pain that is mild to moderate. She has tried Ibuprofen. She has used 2 pads today. She has not had a follow-up appointment with a [facility] in [place] since the procedures. She has been sexually active without protection since the TAB.   Abdominal Pain  This is a new problem. The current episode started 1 to 4 weeks ago. The onset quality is gradual. The pain is mild. The quality of the pain is cramping. Pertinent negatives include no constipation, diarrhea, dysuria, fever, frequency, nausea or vomiting. She has tried nothing for the symptoms.  Vaginal Bleeding  The patient's primary symptoms include pelvic pain and vaginal bleeding. The patient's pertinent negatives include no genital itching, genital lesions, genital odor or vaginal discharge. This is a recurrent problem. The current episode started 1 to 4 weeks ago. The problem has been unchanged. The pain is mild. Associated symptoms include abdominal pain and back pain. Pertinent negatives include no constipation, diarrhea, dysuria, fever, frequency, nausea or vomiting. The vaginal discharge was bloody. The vaginal bleeding is lighter than menses. She has not been passing clots. She has not been passing tissue. Nothing aggravates the symptoms. She has tried nothing for the symptoms.   RN note reports she had an abortion about 2 months and is still bleeding. States the bleeding is heavier today and is having pain in abdomen and back. Changing a pad 2-3 times per day.  States when the doctor was doing the procedure he had to go back in and remove tissue that he may have forgotten so pt is worried about retained products. Pain score: back pain 8/10, abdominal pain 9/10   OB History    Gravida  2   Para      Term      Preterm      AB  2   Living  0     SAB      TAB      Ectopic      Multiple      Live Births              Past Medical History:  Diagnosis Date  . Medical history non-contributory   . STD (female)     Past Surgical History:  Procedure Laterality Date  . DILATION AND CURETTAGE OF UTERUS      No family history on file.  Social History   Tobacco Use  . Smoking status: Current Some Day Smoker  . Smokeless tobacco: Never Used  Substance Use Topics  . Alcohol use: Yes    Comment: socially  . Drug use: No    Allergies: No Known Allergies  Medications Prior to Admission  Medication Sig Dispense Refill Last Dose  . doxycycline (VIBRAMYCIN) 100 MG capsule Take 1 capsule (100 mg total) by mouth 2 (two) times daily. 28 capsule 0     Review of Systems  Constitutional: Negative for fever.  Gastrointestinal: Positive for abdominal pain. Negative for constipation, diarrhea,  nausea and vomiting.  Genitourinary: Positive for pelvic pain and vaginal bleeding. Negative for dysuria, frequency and vaginal discharge.  Musculoskeletal: Positive for back pain.   Physical Exam   Blood pressure 118/69, pulse 73, temperature 99 F (37.2 C), resp. rate 18, height 5\' 6"  (1.676 m), weight 85.4 kg, last menstrual period 11/20/2017, unknown if currently breastfeeding.  Physical Exam  Nursing note and vitals reviewed. Constitutional: She is oriented to person, place, and time. She appears well-developed and well-nourished. No distress.  HENT:  Head: Normocephalic and atraumatic.  Cardiovascular: Normal rate.  Respiratory: Effort normal.  GI: Soft. She exhibits no distension and no mass. There is no abdominal tenderness.  There is no rebound and no guarding.  Genitourinary: Uterus is not enlarged and not tender. Cervix exhibits no motion tenderness, no discharge and no friability. Right adnexum displays no mass and no tenderness. Left adnexum displays no mass and no tenderness.    Vaginal bleeding (small) present.     No vaginal discharge.  There is bleeding (small) in the vagina.  Neurological: She is alert and oriented to person, place, and time.  Skin: Skin is warm and dry. No erythema.  Psychiatric: She has a normal mood and affect.    MAU Course  Procedures None  MDM UA, CBC, hCG today  1600 - Labs pending. Care turned over to Wynelle Bourgeois, CNM   Vonzella Nipple, PA-C 03/08/2018, 4:02 PM  Assessment and Plan  Assumed care from previous provider  Awaiting labs Does not appear uncomfortable, talking on phone Results for orders placed or performed during the hospital encounter of 03/08/18 (from the past 24 hour(s))  Urinalysis, Routine w reflex microscopic     Status: Abnormal   Collection Time: 03/08/18  2:38 PM  Result Value Ref Range   Color, Urine YELLOW YELLOW   APPearance CLEAR CLEAR   Specific Gravity, Urine 1.020 1.005 - 1.030   pH 7.5 5.0 - 8.0   Glucose, UA NEGATIVE NEGATIVE mg/dL   Hgb urine dipstick LARGE (A) NEGATIVE   Bilirubin Urine NEGATIVE NEGATIVE   Ketones, ur NEGATIVE NEGATIVE mg/dL   Protein, ur NEGATIVE NEGATIVE mg/dL   Nitrite NEGATIVE NEGATIVE   Leukocytes,Ua NEGATIVE NEGATIVE  Urinalysis, Microscopic (reflex)     Status: None   Collection Time: 03/08/18  2:38 PM  Result Value Ref Range   RBC / HPF 0-5 0 - 5 RBC/hpf   WBC, UA NONE SEEN 0 - 5 WBC/hpf   Bacteria, UA NONE SEEN NONE SEEN   Squamous Epithelial / LPF 0-5 0 - 5  hCG, quantitative, pregnancy     Status: Abnormal   Collection Time: 03/08/18  3:25 PM  Result Value Ref Range   hCG, Beta Chain, Quant, S 21 (H) <5 mIU/mL  CBC with Differential/Platelet     Status: Abnormal   Collection Time: 03/08/18  3:25  PM  Result Value Ref Range   WBC 3.5 (L) 4.0 - 10.5 K/uL   RBC 3.94 3.87 - 5.11 MIL/uL   Hemoglobin 12.6 12.0 - 15.0 g/dL   HCT 79.1 50.4 - 13.6 %   MCV 97.7 80.0 - 100.0 fL   MCH 32.0 26.0 - 34.0 pg   MCHC 32.7 30.0 - 36.0 g/dL   RDW 43.8 37.7 - 93.9 %   Platelets 193 150 - 400 K/uL   nRBC 0.0 0.0 - 0.2 %   Neutrophils Relative % 40 %   Neutro Abs 1.4 (L) 1.7 - 7.7 K/uL   Lymphocytes Relative 50 %  Lymphs Abs 1.7 0.7 - 4.0 K/uL   Monocytes Relative 8 %   Monocytes Absolute 0.3 0.1 - 1.0 K/uL   Eosinophils Relative 2 %   Eosinophils Absolute 0.1 0.0 - 0.5 K/uL   Basophils Relative 0 %   Basophils Absolute 0.0 0.0 - 0.1 K/uL   [place] ordered US Ob Transvaginal  Result Date: 03/08/2018 CLINICAL DATA:  Vaginal bleeding.  Elective abortion 8 weeks ago. EXAM: OBSTETRIC <14 WK [place] AND TRANSVAGINAL OB US TECHNIQUE: Both transabdominal and transvaginal ultrasound examinations were performed for complete evaluation of the gestation as well as the maternal uterus, adnexal regions, and pelvic cul-de-sac. Transvaginal technique was performed to assess early pregnancy. COMPARISON:  01/10/2018. FINDINGS: Intrauterine gestational sac: Not visualized. Yolk sac:  Not visualized. Embryo:  Not visualized. Cardiac Activity: Not visualized. Subchorionic hemorrhage:  N/A Maternal uterus/adnexae: Endometrium is thickened and demonstrates heterogeneous echogenicity, measuring up to 19 mm in thickness. Sonographer identifies a focal region of hypervascularity within the endometrium. Left ovary unremarkable. 2.8 cm cystic lesion in the right ovary may represent a dominant follicle. Small volume free fluid noted in the cul-de-sac. IMPRESSION: Thick, heterogeneous endometrium with focal vascularity on Doppler imaging. Sonographic findings are compatible with retained products of conception in the appropriate clinical setting. Electronically Signed   By: Kennith Center M.D.   On: 03/08/2018 18:01   Consulted Dr Debroah Loop who  recommends outpatient [facility] for retained products Since she did have IC since AB, will recheck HCG in 2 days Appointment made for Tuesday at 0900  Message sent to scheduler for [facility] within the week  A:  Retained products of conception after elective abortion       Cramping      Bleeding  P:  Discharge home       Bleeding precautions       Return to clinic Tuesday at 0900 for followup HCG       Scheduler will schedule D&C this week. Encouraged to return here or to other Urgent Care/ED if she develops worsening of symptoms, increase in pain, fever, or other concerning symptoms.

## 2018-03-08 NOTE — Discharge Instructions (Signed)
Dilation and Curettage or Vacuum Curettage, Care After These instructions give you information about caring for yourself after your procedure. Your doctor may also give you more specific instructions. Call your doctor if you have any problems or questions after your procedure. Follow these instructions at home: Activity  Do not drive or use heavy machinery while taking prescription pain medicine.  For 24 hours after your procedure, avoid driving.  Take short walks often, followed by rest periods. Ask your doctor what activities are safe for you. After one or two days, you may be able to return to your normal activities.  Do not lift anything that is heavier than 10 lb (4.5 kg) until your doctor approves.  For at least 2 weeks, or as long as told by your doctor: ? Do not douche. ? Do not use tampons. ? Do not have sex. General instructions   Take over-the-counter and prescription medicines only as told by your doctor. This is very important if you take blood thinning medicine.  Do not take baths, swim, or use a hot tub until your doctor approves. Take showers instead of baths.  Wear compression stockings as told by your doctor.  It is up to you to get the results of your procedure. Ask your doctor when your results will be ready.  Keep all follow-up visits as told by your doctor. This is important. Contact a doctor if:  You have very bad cramps that get worse or do not get better with medicine.  You have very bad pain in your belly (abdomen).  You cannot drink fluids without throwing up (vomiting).  You get pain in a different part of the area between your belly and thighs (pelvis).  You have bad-smelling discharge from your vagina.  You have a rash. Get help right away if:  You are bleeding a lot from your vagina. A lot of bleeding means soaking more than one sanitary pad in an hour, for 2 hours in a row.  You have clumps of blood (blood clots) coming from your  vagina.  You have a fever or chills.  Your belly feels very tender or hard.  You have chest pain.  You have trouble breathing.  You cough up blood.  You feel dizzy.  You feel light-headed.  You pass out (faint).  You have pain in your neck or shoulder area. Summary  Take short walks often, followed by rest periods. Ask your doctor what activities are safe for you. After one or two days, you may be able to return to your normal activities.  Do not lift anything that is heavier than 10 lb (4.5 kg) until your doctor approves.  Do not take baths, swim, or use a hot tub until your doctor approves. Take showers instead of baths.  Contact your doctor if you have any symptoms of infection, like bad-smelling discharge from your vagina. This information is not intended to replace advice given to you by your health care provider. Make sure you discuss any questions you have with your health care provider. Document Released: 10/11/2007 Document Revised: 09/19/2015 Document Reviewed: 09/19/2015 Elsevier Interactive Patient Education  2019 [employer] Dilation and Curettage or Vacuum Curettage, Care After These instructions give you information about caring for yourself after your procedure. Your doctor may also give you more specific instructions. Call your doctor if you have any problems or questions after your procedure. Follow these instructions at home: Activity  Do not drive or use heavy machinery while taking prescription pain  medicine.  For 24 hours after your procedure, avoid driving.  Take short walks often, followed by rest periods. Ask your doctor what activities are safe for you. After one or two days, you may be able to return to your normal activities.  Do not lift anything that is heavier than 10 lb (4.5 kg) until your doctor approves.  For at least 2 weeks, or as long as told by your doctor: ? Do not douche. ? Do not use tampons. ? Do not have sex. General  instructions   Take over-the-counter and prescription medicines only as told by your doctor. This is very important if you take blood thinning medicine.  Do not take baths, swim, or use a hot tub until your doctor approves. Take showers instead of baths.  Wear compression stockings as told by your doctor.  It is up to you to get the results of your procedure. Ask your doctor when your results will be ready.  Keep all follow-up visits as told by your doctor. This is important. Contact a doctor if:  You have very bad cramps that get worse or do not get better with medicine.  You have very bad pain in your belly (abdomen).  You cannot drink fluids without throwing up (vomiting).  You get pain in a different part of the area between your belly and thighs (pelvis).  You have bad-smelling discharge from your vagina.  You have a rash. Get help right away if:  You are bleeding a lot from your vagina. A lot of bleeding means soaking more than one sanitary pad in an hour, for 2 hours in a row.  You have clumps of blood (blood clots) coming from your vagina.  You have a fever or chills.  Your belly feels very tender or hard.  You have chest pain.  You have trouble breathing.  You cough up blood.  You feel dizzy.  You feel light-headed.  You pass out (faint).  You have pain in your neck or shoulder area. Summary  Take short walks often, followed by rest periods. Ask your doctor what activities are safe for you. After one or two days, you may be able to return to your normal activities.  Do not lift anything that is heavier than 10 lb (4.5 kg) until your doctor approves.  Do not take baths, swim, or use a hot tub until your doctor approves. Take showers instead of baths.  Contact your doctor if you have any symptoms of infection, like bad-smelling discharge from your vagina. This information is not intended to replace advice given to you by your health care provider. Make  sure you discuss any questions you have with your health care provider. Document Released: 10/11/2007 Document Revised: 09/19/2015 Document Reviewed: 09/19/2015 Elsevier Interactive Patient Education  2019 [employer].

## 2018-03-10 ENCOUNTER — Encounter (HOSPITAL_BASED_OUTPATIENT_CLINIC_OR_DEPARTMENT_OTHER): Payer: Self-pay | Admitting: *Deleted

## 2018-03-10 ENCOUNTER — Other Ambulatory Visit: Payer: Self-pay

## 2018-03-11 ENCOUNTER — Ambulatory Visit: Payer: Self-pay

## 2018-03-12 LAB — BETA HCG QUANT (REF LAB): hCG Quant: 74492 m[IU]/mL

## 2018-03-12 NOTE — Progress Notes (Signed)
Pt here today for STAT Beta Lab s/p retained products of conception.  Pt denies an pain or bleeding.  Pt advised that she will notified of results and f/u within two hours if she could please stay in waiting in room.  Pt agreed.  Pt came to window and stated that she needed to go leave.  Notified provider who stated that she would be able to leave but to please stay bu the phone for results.  LM for pt that we are calling with results to please give the office a call.

## 2018-03-17 ENCOUNTER — Telehealth: Payer: Self-pay | Admitting: Obstetrics and Gynecology

## 2018-03-17 ENCOUNTER — Telehealth: Payer: Self-pay | Admitting: *Deleted

## 2018-03-17 ENCOUNTER — Telehealth: Payer: Self-pay | Admitting: Family Medicine

## 2018-03-17 NOTE — Telephone Encounter (Signed)
-----   Message from Conan Bowens, MD sent at 03/15/2018  1:59 AM EST ----- Regarding: RE: Schedule D&C for retained POC Patient has increasing HCG (was repeated since she has been having unprotected IC), needs [place] and HCG ASAP this week before D&C on Wednesday.   ----- Message ----- From: Pennie Banter Sent: 03/10/2018  12:03 PM EST To: Darrel Hoover, RN, Olevia Bowens, # Subject: FW: Schedule D&C for retained POC              I posted her for 03/12/18, but patient could not do this date.  I have rescheduled her for 03/19/18 at 10:45am with Dr. Earlene Plater at [facility].    Dx code:  O68.4 Cpt code: 78938 ----- Message ----- From: Aviva Signs, CNM Sent: 03/08/2018   6:40 PM EST To: Tera Partridge Battle Subject: Schedule D&C for retained POC                  Retained tissue after abortion  Per Dr Debroah Loop, schedule D&C next week (2/25-28/2020) as outpatient GYN surgery  Will go to clinic Tues for repeat blood test  Hilda Lias

## 2018-03-17 NOTE — Telephone Encounter (Signed)
I called  To schedule [place] and there are no slots available. I called Korea tech and  got [place] scheduled as overbook for 03/18/18 at 2:30pm. Called patient to notify and got voicemail- left message we have you scheduled for [place] tomorrow Tuesday 3/3 at 2:30 ;and need lab - please call our office to discuss. When patient calls need to tell us this is only time [place] can be done and must be done before surgery- also needs to come for lab draw Tuesday.

## 2018-03-17 NOTE — Telephone Encounter (Signed)
Kristina Day called back. I informed her Dr. Earlene Plater wants her to have another Korea and bhcg lab before she can do surgery on Wednesday 03/19/18 because her bhcg went up significantly. She declines and says she cannot do that, that she knows she is not pregnant and she just needs the tissue taken out.  She declines pain and states still bleeding like she has been. I informed her I will forward to her provider. She voices understanding.

## 2018-03-17 NOTE — Telephone Encounter (Signed)
Called patient to give her information about an appointment to come in on Wednesday to see Dr Shawnie Pons.

## 2018-03-17 NOTE — Telephone Encounter (Signed)
Patient is s/p TAB with retained POCs, scheduled for D&C in 2 days. H/o intercourse after TAB, had repeat HCG that increased from initial presentation. Called patient to review need for repeat HCG and [place] prior to [facility] on Wednesday, patient declines to have either and wants to just have D&C. I explained that with increasing HCG, we need more information about what is going on, possible new pregnancy vs another source. She states she knows she is not pregnant and just needs the D&C. She was understandably frustrated and expressed frustration that there are retained products to begin with, now she is being told she needs more procedures when all she wants is the [facility]. I reviewed that with an increasing HCG, we need more information prior to performing a procedure. She states we should have all the information we need with the blood she has already given. I tried to review her results and why I would like her to have another ultrasound, she declines more blood work or ultrasound. I offered a provider visit on Wednesday (day she was scheduled for [facility]) to discuss next steps and that the [facility] would be canceled. She verbalized understanding and is agreeable to provider visit, the office will call her with a time.  Baldemar Lenis, M.D. Attending Center for [employer] [facility])

## 2018-03-18 ENCOUNTER — Ambulatory Visit (HOSPITAL_COMMUNITY)
Admission: RE | Admit: 2018-03-18 | Discharge: 2018-03-18 | Disposition: A | Discharge status: Home / Self Care | Payer: Self-pay | Source: Ambulatory Visit | Attending: Obstetrics and Gynecology | Admitting: Obstetrics and Gynecology

## 2018-03-19 ENCOUNTER — Encounter: Payer: Self-pay | Admitting: Family Medicine

## 2018-03-19 ENCOUNTER — Ambulatory Visit (INDEPENDENT_AMBULATORY_CARE_PROVIDER_SITE_OTHER): Payer: Self-pay | Admitting: Family Medicine

## 2018-03-19 ENCOUNTER — Ambulatory Visit (HOSPITAL_BASED_OUTPATIENT_CLINIC_OR_DEPARTMENT_OTHER): Admission: RE | Admit: 2018-03-19 | Payer: Self-pay | Source: Home / Self Care | Admitting: Obstetrics and Gynecology

## 2018-03-19 DIAGNOSIS — Z3040 Encounter for surveillance of contraceptives, unspecified: Secondary | ICD-10-CM

## 2018-03-19 SURGERY — DILATION AND CURETTAGE
Anesthesia: General

## 2018-03-19 NOTE — Assessment & Plan Note (Signed)
?   And very small, should be a candidate for misoprostol. Need to r/o new pregnancy--though second HCG is spurious and would not be expected following 21. No evidence of molar pregnancy on u/s. Will repeat HCG today, suspect 2/25 one is spurious. If still elevated then need evacuation to r/o mole. If down further, would consider cytotec.

## 2018-03-19 NOTE — Patient Instructions (Signed)

## 2018-03-19 NOTE — Progress Notes (Signed)
   Subjective:    Patient ID: Kristina Day is a 28 y.o. female presenting with Follow-up  on 03/19/2018  HPI: Patient here for f/u today. Has had TAB approx. 2 months ago. Was found to have retained POC possibly at 2 cm with HCG of 21. 3 days later HCG was 25003 on 03/11/2018. Repeat US does not show IUP. Shows possbile RPOC at 9 mm now. Still having slight bleeding.  Review of Systems  Constitutional: Negative for chills and fever.  Respiratory: Negative for shortness of breath.   Cardiovascular: Negative for chest pain.  Gastrointestinal: Negative for abdominal pain, nausea and vomiting.  Genitourinary: Negative for dysuria.  Skin: Negative for rash.      Objective:    BP 122/84   Pulse 78   Wt 187 lb 9.6 oz (85.1 kg)   BMI 30.28 kg/m  Physical Exam Constitutional:      General: She is not in acute distress.    Appearance: She is well-developed.  HENT:     Head: Normocephalic and atraumatic.  Eyes:     General: No scleral icterus. Neck:     Musculoskeletal: Neck supple.  Cardiovascular:     Rate and Rhythm: Normal rate.  Pulmonary:     Effort: Pulmonary effort is normal.  Abdominal:     Palpations: Abdomen is soft.  Skin:    General: Skin is warm and dry.  Neurological:     Mental Status: She is alert and oriented to person, place, and time.         Assessment & Plan:   Problem List Items Addressed This Visit      Unprioritized   Retained products of conception after delivery without hemorrhage - Primary    ? And very small, should be a candidate for misoprostol. Need to r/o new pregnancy--though second HCG is spurious and would not be expected following 21. No evidence of molar pregnancy on u/s. Will repeat HCG today, suspect 2/25 one is spurious. If still elevated then need evacuation to r/o mole. If down further, would consider cytotec.      Relevant Orders   Beta hCG quant (ref lab)    Other Visit Diagnoses    Encounter for surveillance of  contraceptives, unspecified contraceptive       Does not want kids--discussed several options. She does not want any form of contraception, information given.     Call after one Total face-to-face time with patient: 15 minutes. Over 50% of encounter was spent on counseling and coordination of care. Return in about 4 weeks (around 04/16/2018).  Reva Bores 03/19/2018 1:23 PM

## 2018-03-20 ENCOUNTER — Telehealth: Payer: Self-pay | Admitting: Family Medicine

## 2018-03-20 LAB — BETA HCG QUANT (REF LAB): HCG QUANT: 10 m[IU]/mL

## 2018-03-20 MED ORDER — MISOPROSTOL 200 MCG PO TABS: 1000.0000 ug | ORAL_TABLET | Freq: Once | ORAL | 0 refills | Status: DC

## 2018-03-20 NOTE — Telephone Encounter (Signed)
HCG reviewed, now 10--will give per vaginal cytotec to rid of any possible retained POC.

## 2018-03-25 ENCOUNTER — Telehealth: Payer: Self-pay

## 2018-03-25 NOTE — Telephone Encounter (Signed)
Advised pt of quant levels and advised if she needed to come in to keep her appointment that was coming up. Pt verbalized understanding and had no questions

## 2018-04-17 ENCOUNTER — Ambulatory Visit: Payer: Self-pay | Admitting: Family Medicine

## 2018-07-13 ENCOUNTER — Encounter (HOSPITAL_COMMUNITY): Payer: Self-pay

## 2018-07-13 ENCOUNTER — Emergency Department (HOSPITAL_COMMUNITY)
Admission: EM | Admit: 2018-07-13 | Discharge: 2018-07-14 | Disposition: A | Discharge status: Home / Self Care | Payer: Self-pay | Attending: Emergency Medicine | Admitting: Emergency Medicine

## 2018-07-13 ENCOUNTER — Other Ambulatory Visit: Payer: Self-pay

## 2018-07-13 ENCOUNTER — Emergency Department (HOSPITAL_COMMUNITY): Payer: Self-pay

## 2018-07-13 DIAGNOSIS — M25572 Pain in left ankle and joints of left foot: Secondary | ICD-10-CM | POA: Insufficient documentation

## 2018-07-13 DIAGNOSIS — F17210 Nicotine dependence, cigarettes, uncomplicated: Secondary | ICD-10-CM | POA: Insufficient documentation

## 2018-07-13 MED ORDER — CEFDINIR 300 MG PO CAPS: 300.0000 mg | ORAL_CAPSULE | Freq: Two times a day (BID) | ORAL | 0 refills | Status: DC

## 2018-07-13 MED ORDER — IBUPROFEN 200 MG PO TABS
600.0000 mg | ORAL_TABLET | Freq: Once | ORAL | Status: AC
  Administered 2018-07-14: 600 mg via ORAL
  Filled 2018-07-13: qty 3

## 2018-07-13 MED ORDER — PHENAZOPYRIDINE HCL 200 MG PO TABS: 200.0000 mg | ORAL_TABLET | Freq: Three times a day (TID) | ORAL | 0 refills | Status: DC

## 2018-07-13 MED ORDER — IBUPROFEN 600 MG PO TABS: 600.0000 mg | ORAL_TABLET | Freq: Four times a day (QID) | ORAL | 0 refills | Status: DC | PRN

## 2018-07-13 MED ORDER — IBUPROFEN 400 MG PO TABS: 400.0000 mg | ORAL_TABLET | Freq: Four times a day (QID) | ORAL | 0 refills | Status: DC | PRN

## 2018-07-13 NOTE — Discharge Instructions (Signed)
X-ray did not feel any fractures.  We will treat this as a high-grade ankle sprain and advised rice treatment along with follow-up with an orthopedic doctor if not getting better in 1 week.
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
## 2018-07-14 NOTE — ED Notes (Signed)
Pt verbalized discharge instructions and follow up care. Alert and ambulatory. No IV. Pt refusing crutches

## 2018-12-29 ENCOUNTER — Inpatient Hospital Stay (HOSPITAL_COMMUNITY)
Admission: AD | Admit: 2018-12-29 | Discharge: 2018-12-29 | Disposition: A | Discharge status: Home / Self Care | Payer: Self-pay | Attending: Obstetrics & Gynecology | Admitting: Obstetrics & Gynecology

## 2018-12-29 ENCOUNTER — Encounter (HOSPITAL_COMMUNITY): Payer: Self-pay | Admitting: Obstetrics & Gynecology

## 2018-12-29 ENCOUNTER — Inpatient Hospital Stay (HOSPITAL_COMMUNITY): Payer: Self-pay

## 2018-12-29 ENCOUNTER — Other Ambulatory Visit: Payer: Self-pay

## 2018-12-29 DIAGNOSIS — O99891 Other specified diseases and conditions complicating pregnancy: Secondary | ICD-10-CM | POA: Insufficient documentation

## 2018-12-29 DIAGNOSIS — O99331 Smoking (tobacco) complicating pregnancy, first trimester: Secondary | ICD-10-CM | POA: Insufficient documentation

## 2018-12-29 DIAGNOSIS — R109 Unspecified abdominal pain: Secondary | ICD-10-CM | POA: Insufficient documentation

## 2018-12-29 DIAGNOSIS — F17210 Nicotine dependence, cigarettes, uncomplicated: Secondary | ICD-10-CM | POA: Insufficient documentation

## 2018-12-29 DIAGNOSIS — O209 Hemorrhage in early pregnancy, unspecified: Secondary | ICD-10-CM | POA: Insufficient documentation

## 2018-12-29 DIAGNOSIS — M545 Low back pain: Secondary | ICD-10-CM | POA: Insufficient documentation

## 2018-12-29 DIAGNOSIS — O26891 Other specified pregnancy related conditions, first trimester: Secondary | ICD-10-CM

## 2018-12-29 DIAGNOSIS — Z3A01 Less than 8 weeks gestation of pregnancy: Secondary | ICD-10-CM | POA: Insufficient documentation

## 2018-12-29 DIAGNOSIS — O3680X0 Pregnancy with inconclusive fetal viability, not applicable or unspecified: Secondary | ICD-10-CM | POA: Insufficient documentation

## 2018-12-29 LAB — URINALYSIS, ROUTINE W REFLEX MICROSCOPIC
Bilirubin Urine: NEGATIVE
Glucose, UA: NEGATIVE mg/dL
Ketones, ur: NEGATIVE mg/dL
Leukocytes,Ua: NEGATIVE
Nitrite: NEGATIVE
Protein, ur: 30 mg/dL — AB
Specific Gravity, Urine: 1.02 (ref 1.005–1.030)
pH: 7 (ref 5.0–8.0)

## 2018-12-29 LAB — WET PREP, GENITAL
Sperm: NONE SEEN
Trich, Wet Prep: NONE SEEN
Yeast Wet Prep HPF POC: NONE SEEN

## 2018-12-29 LAB — CBC
HCT: 40.6 % (ref 36.0–46.0)
Hemoglobin: 13.9 g/dL (ref 12.0–15.0)
MCH: 32.5 pg (ref 26.0–34.0)
MCHC: 34.2 g/dL (ref 30.0–36.0)
MCV: 94.9 fL (ref 80.0–100.0)
Platelets: 215 10*3/uL (ref 150–400)
RBC: 4.28 MIL/uL (ref 3.87–5.11)
RDW: 11.4 % — ABNORMAL LOW (ref 11.5–15.5)
WBC: 2.9 10*3/uL — ABNORMAL LOW (ref 4.0–10.5)
nRBC: 0 % (ref 0.0–0.2)

## 2018-12-29 LAB — HCG, QUANTITATIVE, PREGNANCY: hCG, Beta Chain, Quant, S: 711 m[IU]/mL — ABNORMAL HIGH (ref <5)

## 2018-12-29 LAB — POCT PREGNANCY, URINE: Preg Test, Ur: POSITIVE — AB

## 2018-12-29 NOTE — Discharge Instructions (Signed)
Return to care  °· If you have heavier bleeding that soaks through more that 2 pads per hour for an hour or more °· If you bleed so much that you feel like you might pass out or you do pass out °· If you have significant abdominal pain that is not improved with Tylenol  ° ° ° ° °Vaginal Bleeding During Pregnancy, First Trimester ° °A small amount of bleeding from the vagina (spotting) is relatively common during early pregnancy. It usually stops on its own. Various things may cause bleeding or spotting during early pregnancy. Some bleeding may be related to the pregnancy, and some may not. In many cases, the bleeding is normal and is not a problem. However, bleeding can also be a sign of something serious. Be sure to tell your health care provider about any vaginal bleeding right away. °Some possible causes of vaginal bleeding during the first trimester include: °· Infection or inflammation of the cervix. °· Growths (polyps) on the cervix. °· Miscarriage or threatened miscarriage. °· Pregnancy tissue developing outside of the uterus (ectopic pregnancy). °· A mass of tissue developing in the uterus due to an egg being fertilized incorrectly (molar pregnancy). °Follow these instructions at home: °Activity °· Follow instructions from your health care provider about limiting your activity. Ask what activities are safe for you. °· If needed, make plans for someone to help with your regular activities. °· Do not have sex or orgasms until your health care provider says that this is safe. °General instructions °· Take over-the-counter and prescription medicines only as told by your health care provider. °· Pay attention to any changes in your symptoms. °· Do not use tampons or douche. °· Write down how many pads you use each day, how often you change pads, and how soaked (saturated) they are. °· If you pass any tissue from your vagina, save the tissue so you can show it to your health care provider. °· Keep all follow-up  visits as told by your health care provider. This is important. °Contact a health care provider if: °· You have vaginal bleeding during any part of your pregnancy. °· You have cramps or labor pains. °· You have a fever. °Get help right away if: °· You have severe cramps in your back or abdomen. °· You pass large clots or a large amount of tissue from your vagina. °· Your bleeding increases. °· You feel light-headed or weak, or you faint. °· You have chills. °· You are leaking fluid or have a gush of fluid from your vagina. °Summary °· A small amount of bleeding (spotting) from the vagina is relatively common during early pregnancy. °· Various things may cause bleeding or spotting in early pregnancy. °· Be sure to tell your health care provider about any vaginal bleeding right away. °This information is not intended to replace advice given to you by your health care provider. Make sure you discuss any questions you have with your health care provider. °Document Released: 10/11/2004 Document Revised: 04/22/2018 Document Reviewed: 04/05/2016 °Elsevier Patient Education © 2020 [employer] ° °

## 2018-12-29 NOTE — MAU Provider Note (Addendum)
Chief Complaint: Vaginal Bleeding   First Provider Initiated Contact with Patient 12/29/18 1226     SUBJECTIVE HPI: Kristina Day is a 28 y.o. G3P0020 at [redacted]w[redacted]d who presents to Maternity Admissions reporting vaginal bleeding and back pain. Symptoms started over a week ago. Reports bright red spotting. Does have to wear a pad but isn't saturating pads or passing clots. Also reports intermittent low back pain that feels like pains he has with her cycles.   Location: back Quality: aching Severity: 8/10 on pain scale Duration: 1 week Timing: intermittent Modifying factors: none Associated signs and symptoms: vaginal bleeding  Past Medical History:  Diagnosis Date  . STD (female)    OB History  Gravida Para Term Preterm AB Living  3       2 0  SAB TAB Ectopic Multiple Live Births    1          # Outcome Date GA Lbr Len/2nd Weight Sex Delivery Anes PTL Lv  3 Current           2 TAB 01/11/18          1 AB            Past Surgical History:  Procedure Laterality Date  . DILATION AND CURETTAGE OF UTERUS     Social History   Socioeconomic History  . Marital status: Single    Spouse name: Not on file  . Number of children: Not on file  . Years of education: Not on file  . Highest education level: Not on file  Occupational History  . Not on file  Tobacco Use  . Smoking status: Current Some Day Smoker    Types: Cigarettes  . Smokeless tobacco: Never Used  . Tobacco comment: rare  Substance and Sexual Activity  . Alcohol use: Yes    Comment: socially  . Drug use: No  . Sexual activity: Yes    Birth control/protection: None  Other Topics Concern  . Not on file  Social History Narrative  . Not on file   Social Determinants of Health   Financial Resource Strain:   . Difficulty of Paying Living Expenses: Not on file  Food Insecurity: No Food Insecurity  . Worried About Charity fundraiser in the Last Year: Never true  . Ran Out of Food in the Last Year: Never true   Transportation Needs: No Transportation Needs  . Lack of Transportation (Medical): No  . Lack of Transportation (Non-Medical): No  Physical Activity:   . Days of Exercise per Week: Not on file  . Minutes of Exercise per Session: Not on file  Stress:   . Feeling of Stress : Not on file  Social Connections:   . Frequency of Communication with Friends and Family: Not on file  . Frequency of Social Gatherings with Friends and Family: Not on file  . Attends Religious Services: Not on file  . Active Member of Clubs or Organizations: Not on file  . Attends Archivist Meetings: Not on file  . Marital Status: Not on file  Intimate Partner Violence:   . Fear of Current or Ex-Partner: Not on file  . Emotionally Abused: Not on file  . Physically Abused: Not on file  . Sexually Abused: Not on file   No family history on file. No current facility-administered medications on file prior to encounter.   No current outpatient medications on file prior to encounter.   No Known Allergies  I have reviewed  patient's Past Medical Hx, Surgical Hx, Family Hx, Social Hx, medications and allergies.   Review of Systems  Constitutional: Negative.   Gastrointestinal: Negative.   Genitourinary: Positive for vaginal bleeding.  Musculoskeletal: Positive for back pain.    OBJECTIVE Patient Vitals for the past 24 hrs:  BP Temp Temp src Pulse Resp SpO2 Height Weight  12/29/18 1504 124/77 -- -- -- -- -- -- --  12/29/18 1159 124/74 98.5 F (36.9 C) Oral 79 18 100 % 5\' 7"  (1.702 m) 91.4 kg   Constitutional: Well-developed, well-nourished female in no acute distress.  Cardiovascular: normal rate & rhythm, no murmur Respiratory: normal rate and effort. Lung sounds clear throughout GI: Abd soft, non-tender, Pos BS x 4. No guarding or rebound tenderness MS: Extremities nontender, no edema, normal ROM Neurologic: Alert and oriented x 4.  GU:     SPECULUM EXAM: NEFG, small amount of dark red blood  coming from os. Cervix pink/smooth/not friable  BIMANUAL: No CMT. cervix closed; uterus normal size, no adnexal tenderness or masses.    LAB RESULTS Results for orders placed or performed during the hospital encounter of 12/29/18 (from the past 24 hour(s))  Pregnancy, urine POC     Status: Abnormal   Collection Time: 12/29/18 11:30 AM  Result Value Ref Range   Preg Test, Ur POSITIVE (A) NEGATIVE  CBC     Status: Abnormal   Collection Time: 12/29/18 12:40 PM  Result Value Ref Range   WBC 2.9 (L) 4.0 - 10.5 K/uL   RBC 4.28 3.87 - 5.11 MIL/uL   Hemoglobin 13.9 12.0 - 15.0 g/dL   HCT 44.040.6 10.236.0 - 72.546.0 %   MCV 94.9 80.0 - 100.0 fL   MCH 32.5 26.0 - 34.0 pg   MCHC 34.2 30.0 - 36.0 g/dL   RDW 36.611.4 (L) 44.011.5 - 34.715.5 %   Platelets 215 150 - 400 K/uL   nRBC 0.0 0.0 - 0.2 %  hCG, quantitative, pregnancy     Status: Abnormal   Collection Time: 12/29/18 12:40 PM  Result Value Ref Range   hCG, Beta Chain, Quant, S 711 (H) <5 mIU/mL  Urinalysis, Routine w reflex microscopic     Status: Abnormal   Collection Time: 12/29/18  1:04 PM  Result Value Ref Range   Color, Urine YELLOW YELLOW   APPearance CLOUDY (A) CLEAR   Specific Gravity, Urine 1.020 1.005 - 1.030   pH 7.0 5.0 - 8.0   Glucose, UA NEGATIVE NEGATIVE mg/dL   Hgb urine dipstick MODERATE (A) NEGATIVE   Bilirubin Urine NEGATIVE NEGATIVE   Ketones, ur NEGATIVE NEGATIVE mg/dL   Protein, ur 30 (A) NEGATIVE mg/dL   Nitrite NEGATIVE NEGATIVE   Leukocytes,Ua NEGATIVE NEGATIVE   RBC / HPF 0-5 0 - 5 RBC/hpf   WBC, UA 0-5 0 - 5 WBC/hpf   Bacteria, UA RARE (A) NONE SEEN   Squamous Epithelial / LPF 0-5 0 - 5   Mucus PRESENT   Wet prep, genital     Status: Abnormal   Collection Time: 12/29/18  1:11 PM  Result Value Ref Range   Yeast Wet Prep HPF POC NONE SEEN NONE SEEN   Trich, Wet Prep NONE SEEN NONE SEEN   Clue Cells Wet Prep HPF POC PRESENT (A) NONE SEEN   WBC, Wet Prep HPF POC MODERATE (A) NONE SEEN   Sperm NONE SEEN     IMAGING US  OB LESS THAN 14 WEEKS WITH OB TRANSVAGINAL  Result Date: 12/29/2018 CLINICAL DATA:  Vaginal  bleeding EXAM: OBSTETRIC <14 WK [place] AND TRANSVAGINAL OB US TECHNIQUE: Both transabdominal and transvaginal ultrasound examinations were performed for complete evaluation of the gestation as well as the maternal uterus, adnexal regions, and pelvic cul-de-sac. Transvaginal technique was performed to assess early pregnancy. COMPARISON:  None. FINDINGS: Intrauterine gestational sac: None Yolk sac:  Not Visualized. Embryo:  Not visualized. Maternal uterus/adnexae: Ovaries are unremarkable. There is no free fluid. There are small hyperechoic foci in the region of the lower uterine segment. IMPRESSION: No evidence of viable intrauterine pregnancy. Small hyperechoic foci in the region of the lower uterine segment may reflect products of conception. Electronically Signed   By: Guadlupe Spanish M.D.   On: 12/29/2018 14:37    MAU COURSE Orders Placed This Encounter  Procedures  . Wet prep, genital  . US OB LESS THAN 14 WEEKS WITH OB TRANSVAGINAL  . CBC  . hCG, quantitative, pregnancy  . Urinalysis, Routine w reflex microscopic  . Pregnancy, urine POC  . Discharge patient   No orders of the defined types were placed in this encounter.   MDM +UPT UA, wet prep, GC/chlamydia, CBC, ABO/Rh, quant hCG, and [place] today to rule out ectopic pregnancy which can be life threatening.   RH positive  Wet prep positive for clue cells. Pt denies vaginal discharge and no concerning discharge on exam.  Ultrasound shows no IUP or adnexal mass. HCG today is 711. Will bring patient back for repeat HCG.   This vaginal bleeding & back pain could represent a normal pregnancy, spontaneous abortion, or even an ectopic pregnancy which can be life-threatening. Cultures were obtained to rule out pelvic infection.   ASSESSMENT 1. Pregnancy of unknown anatomic location   2. Vaginal bleeding in pregnancy, first trimester   3. Abdominal pain  during pregnancy in first trimester     PLAN Discharge home in stable condition. Bleeding vs ectopic precautions GC/CT pending Pt scheduled for repeat HCG at [facility] on Thursday  Follow-up Information    [facility] Follow up.   Specialty: Obstetrics and Gynecology Why: return for worsening symptoms Contact information: [address] [id number] Wilhemina Bonito [place] [address] [phone]         Allergies as of 12/29/2018   No Known Allergies     Medication List    STOP taking these medications   ibuprofen 600 MG tablet Commonly known as: ADVIL   misoprostol 200 MCG tablet Commonly known as: Cytotec        Judeth Horn, NP 12/29/2018  7:05 PM

## 2018-12-29 NOTE — MAU Note (Signed)
Pt reports vaginal bleeding since first week in December. Also having cramping in her back. Hx of abortion in January 2020.

## 2018-12-30 LAB — GC/CHLAMYDIA PROBE AMP (~~LOC~~) NOT AT ARMC
Chlamydia: NEGATIVE
Comment: NEGATIVE
Comment: NORMAL
Neisseria Gonorrhea: NEGATIVE

## 2019-01-01 ENCOUNTER — Other Ambulatory Visit: Payer: Self-pay

## 2019-01-01 ENCOUNTER — Ambulatory Visit (INDEPENDENT_AMBULATORY_CARE_PROVIDER_SITE_OTHER): Payer: Self-pay | Admitting: General Practice

## 2019-01-01 DIAGNOSIS — O3680X0 Pregnancy with inconclusive fetal viability, not applicable or unspecified: Secondary | ICD-10-CM

## 2019-01-01 LAB — BETA HCG QUANT (REF LAB): hCG Quant: 348 m[IU]/mL

## 2019-01-01 NOTE — Progress Notes (Signed)
Patient presents to office today for stat bhcg following up from 12/14 MAU visit. Patient reports her bleeding has slowed down even more and is only spotting now. Denies pain. Discussed with patient we are monitoring your bhcg levels today, results/history will be reviewed with a provider which takes approximately 2 hours to finalize, then we will call you with results/updated plan of care. Patient verbalized understanding & provided call back number [phone].   Reviewed results with Dr Rosana Hoes who finds decreasing bhcg levels consistent with SAB. Patient should follow up in 1 week for repeat bhcg to follow down to 0.   Called patient and informed her of results consistent with miscarriage. Discussed follow up bhcg in 1 week & offered appt 12/23 @ 9am. Patient verbalized understanding to all & had no questions.  Koren Bound RN BSN 01/01/19

## 2019-01-02 NOTE — Progress Notes (Signed)
I have reviewed this chart and agree with the RN/CMA assessment and management.    K. Meryl Shakera Ebrahimi, M.D. Attending [facility] (Faculty Practice)   

## 2019-03-25 HISTORY — PX: LIPOSUCTION AUOLOGOUS FAT TRANSFER TO BUTTOCKS: SHX6685

## 2019-04-03 ENCOUNTER — Ambulatory Visit (HOSPITAL_COMMUNITY)
Admission: EM | Admit: 2019-04-03 | Discharge: 2019-04-03 | Disposition: A | Discharge status: Home / Self Care | Payer: 59 | Attending: Emergency Medicine | Admitting: Emergency Medicine

## 2019-04-03 ENCOUNTER — Other Ambulatory Visit: Payer: Self-pay

## 2019-04-03 ENCOUNTER — Encounter (HOSPITAL_COMMUNITY): Payer: Self-pay

## 2019-04-03 DIAGNOSIS — R609 Edema, unspecified: Secondary | ICD-10-CM | POA: Diagnosis present

## 2019-04-03 DIAGNOSIS — G8918 Other acute postprocedural pain: Secondary | ICD-10-CM | POA: Diagnosis present

## 2019-04-03 LAB — CBC
HCT: 31.9 % — ABNORMAL LOW (ref 36.0–46.0)
Hemoglobin: 10.6 g/dL — ABNORMAL LOW (ref 12.0–15.0)
MCH: 32.2 pg (ref 26.0–34.0)
MCHC: 33.2 g/dL (ref 30.0–36.0)
MCV: 97 fL (ref 80.0–100.0)
Platelets: 338 10*3/uL (ref 150–400)
RBC: 3.29 MIL/uL — ABNORMAL LOW (ref 3.87–5.11)
RDW: 13.1 % (ref 11.5–15.5)
WBC: 7.8 10*3/uL (ref 4.0–10.5)
nRBC: 0 % (ref 0.0–0.2)

## 2019-04-03 MED ORDER — IBUPROFEN 600 MG PO TABS: 600.0000 mg | ORAL_TABLET | Freq: Four times a day (QID) | ORAL | 0 refills | Status: DC | PRN

## 2019-04-03 MED ORDER — HYDROCODONE-ACETAMINOPHEN 5-325 MG PO TABS: 1.0000 | ORAL_TABLET | Freq: Four times a day (QID) | ORAL | 0 refills | Status: DC | PRN

## 2019-04-03 NOTE — Discharge Instructions (Addendum)
You can take 600 mg ibuprofen with a Tylenol containing product 3-4 times a day as needed for pain.  Take 600 mg of ibuprofen with 1000 mg of plain Tylenol for mild to moderate pain, and if this does not work, then take the ibuprofen with the 1-2 Norco for severe pain the next time you are scheduled to take pain medication.  Follow-up with either Dr. Izora Ribas or Dr. Benna Dunks, who are both excellent.  I will contact you if your CBC comes back abnormal.  If it comes back abnormal, then I would recommend you go to the emergency room to rule out infection.

## 2019-04-03 NOTE — ED Provider Notes (Signed)
HPI  SUBJECTIVE:  Kristina Day is a 29 y.o. female who presents with diffuse abdominal, buttock pain described as constant heaviness and soreness. She is 9 days postop liposuction of the abdomen with fat graft to the buttocks.  She had the surgery in [place].  She states that she healed too quickly and that the holes that were meant to act as drains closed up.  She reports swelling in her abdomen and buttocks.  No fevers, body aches.  She reports headaches.  No erythema.  She tried Tylenol 325 in the morning and Tylenol PM at night only without improvement in her symptoms.  She states that the oxycodone that the plastic surgeon in [place] prescribed to her helped, but she has run out.  She has also tried massage which made her pain worse.  Symptoms are worse with palpation movement.  She denies Covid symptoms of body aches nasal congestion sore throat loss of sense of taste or smell cough shortness of breath nausea vomiting diarrhea abdominal pain.  States that she called the surgeon's office in [place] for guidance and was told to consult a local surgeon or go to the emergency department.  Past medical history negative for diabetes HIV immunocompromise hypertension.    Past Medical History:  Diagnosis Date  . STD (female)     Past Surgical History:  Procedure Laterality Date  . DILATION AND CURETTAGE OF UTERUS    . LIPOSUCTION AUOLOGOUS FAT TRANSFER TO BUTTOCKS  03/25/2019    Family History  Problem Relation Age of Onset  . Diabetes Mother     Social History   Tobacco Use  . Smoking status: Current Some Day Smoker    Types: Cigarettes  . Smokeless tobacco: Never Used  . Tobacco comment: rare  Substance Use Topics  . Alcohol use: Yes    Comment: socially  . Drug use: No    No current facility-administered medications for this encounter.  Current Outpatient Medications:  .  cephALEXin (KEFLEX) 500 MG capsule, Take 500 mg by mouth 4 (four) times daily., Disp: , Rfl:  .   enoxaparin (LOVENOX) 40 MG/0.4ML injection, INJECT 1 DOSE 4 HOURS PRIOR TO TRAVEL, Disp: , Rfl:  .  HYDROcodone-acetaminophen (NORCO/VICODIN) 5-325 MG tablet, Take 1-2 tablets by mouth every 6 (six) hours as needed for moderate pain or severe pain., Disp: 12 tablet, Rfl: 0 .  ibuprofen (ADVIL) 600 MG tablet, Take 1 tablet (600 mg total) by mouth every 6 (six) hours as needed., Disp: 30 tablet, Rfl: 0 .  oxyCODONE-acetaminophen (PERCOCET/ROXICET) 5-325 MG tablet, Take 1 tablet by mouth every 4 (four) hours as needed., Disp: , Rfl:   No Known Allergies   ROS  As noted in HPI.   Physical Exam  BP 124/79 (BP Location: Left Arm)   Pulse 94   Temp 99 F (37.2 C) (Oral)   Resp 18   LMP 03/09/2019 (Approximate)   SpO2 99%   Breastfeeding Unknown   Constitutional: Well developed, well nourished, no acute distress Eyes:  EOMI, conjunctiva normal bilaterally HENT: Normocephalic, atraumatic,mucus membranes moist Respiratory: Normal inspiratory effort Cardiovascular: Normal rate GI: nondistended Skin multiple healed tender surgical scars over the abdomen, buttocks.  No surrounding erythema.  No expressible purulence or serosanguineous drainage.  Diffuse tenderness over the abdomen, buttocks.  Positive pitting edema over the buttocks.  No erythema, increased temperature over the abdomen, buttocks.  :                 Musculoskeletal: no  deformities Neurologic: Alert & oriented x 3, no focal neuro deficits Psychiatric: Speech and behavior appropriate   ED Course   Medications - No data to display  Orders Placed This Encounter  Procedures  . CBC    Standing Status:   Standing    Number of Occurrences:   1    Results for orders placed or performed during the hospital encounter of 04/03/19 (from the past 24 hour(s))  CBC     Status: Abnormal   Collection Time: 04/03/19  6:31 PM  Result Value Ref Range   WBC 7.8 4.0 - 10.5 K/uL   RBC 3.29 (L) 3.87 - 5.11 MIL/uL    Hemoglobin 10.6 (L) 12.0 - 15.0 g/dL   HCT 94.1 (L) 74.0 - 81.4 %   MCV 97.0 80.0 - 100.0 fL   MCH 32.2 26.0 - 34.0 pg   MCHC 33.2 30.0 - 36.0 g/dL   RDW 48.1 85.6 - 31.4 %   Platelets 338 150 - 400 K/uL   nRBC 0.0 0.0 - 0.2 %   No results found.  ED Clinical Impression  1. Postoperative edema   2. Postoperative pain      ED Assessment/Plan  [place] Narcotic database reviewed for this patient, and feel that the risk/benefit ratio today is favorable for proceeding with a prescription for controlled substance.  No opiate prescriptions in [place] in the past 2 years.  Patient with postoperative pain and swelling.  No obvious evidence of infection, however will send off a CBC.  If elevated white count, will have her go to the ED for possible infection.  We will have her follow-up with Dr. Benna Dunks or Izora Ribas, plastic surgeon.  Encouraged her to call the surgeon where she had the surgery done for further guidance.  We will contact patient at [phone] if CBC comes back abnormal discussed with her that she will need go to the emergency department in that case.  Home with 600 mg of ibuprofen combined with Tylenol containing product 3-4 times a day as needed for pain.  Plain Tylenol for mild to moderate pain, 1-2 Norco for severe pain.  No white count.  Anemic, but she is postoperative 9 days.  Discussed labs, MDM, treatment plan, and plan for follow-up with patient. Discussed sn/sx that should prompt return to the ED. patient agrees with plan.   Meds ordered this encounter  Medications  . HYDROcodone-acetaminophen (NORCO/VICODIN) 5-325 MG tablet    Sig: Take 1-2 tablets by mouth every 6 (six) hours as needed for moderate pain or severe pain.    Dispense:  12 tablet    Refill:  0  . ibuprofen (ADVIL) 600 MG tablet    Sig: Take 1 tablet (600 mg total) by mouth every 6 (six) hours as needed.    Dispense:  30 tablet    Refill:  0    *This clinic note was created using Administrator, sports. Therefore, there may be occasional mistakes despite careful proofreading.   ?    Domenick Gong, MD 04/05/19 [id number]

## 2019-04-03 NOTE — ED Triage Notes (Signed)
Pt c/o post surgical pain after having liposuction to abdomen with lipo graft to buttocks (BBL) surgery in [place] on 03/10.  Pt states the pain has not improved since having surgery and "feels like I'm holding onto fluid". States that surgeon only prescribed 8 oxycodone and tylenol is not managing the pain.  Denies fever, chills, no drainage from wound sites. Pt states wound sites appear healed.

## 2019-04-04 ENCOUNTER — Telehealth (HOSPITAL_COMMUNITY): Payer: Self-pay | Admitting: Emergency Medicine

## 2019-04-04 NOTE — Telephone Encounter (Signed)
Attempted to call pt did not answer LVMM

## 2019-11-08 IMAGING — CR LEFT ANKLE COMPLETE - 3+ VIEW
3 series · 3 of 3 positions shown · non-contrast
Comparison: None

CLINICAL DATA: Swelling, injury

EXAM:
LEFT ANKLE COMPLETE - 3+ VIEW

[x ankle ap left]
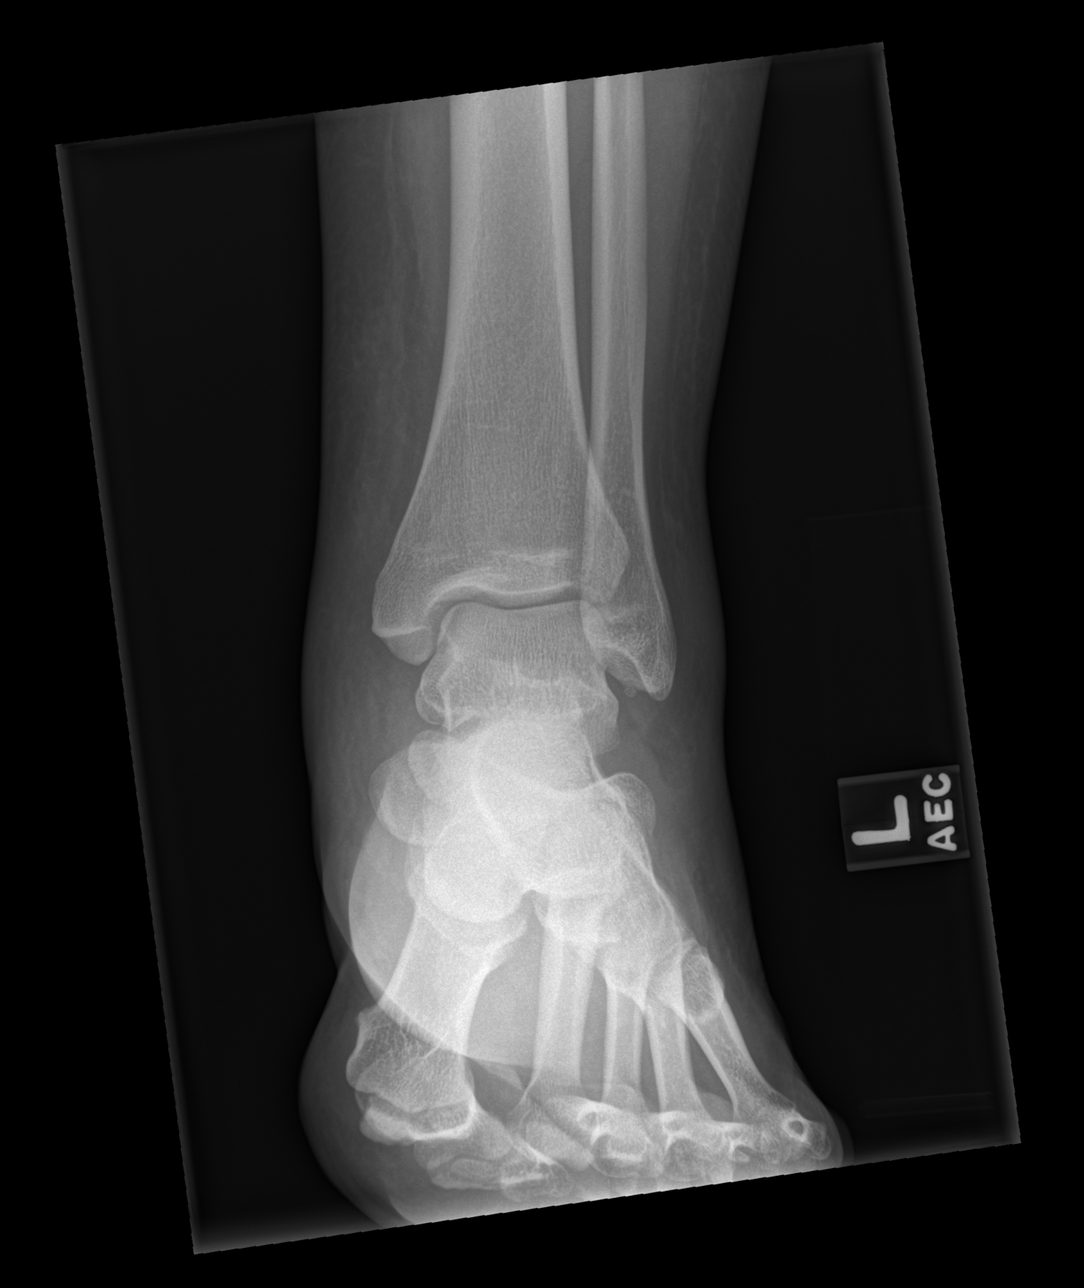

[x ankle obl left]
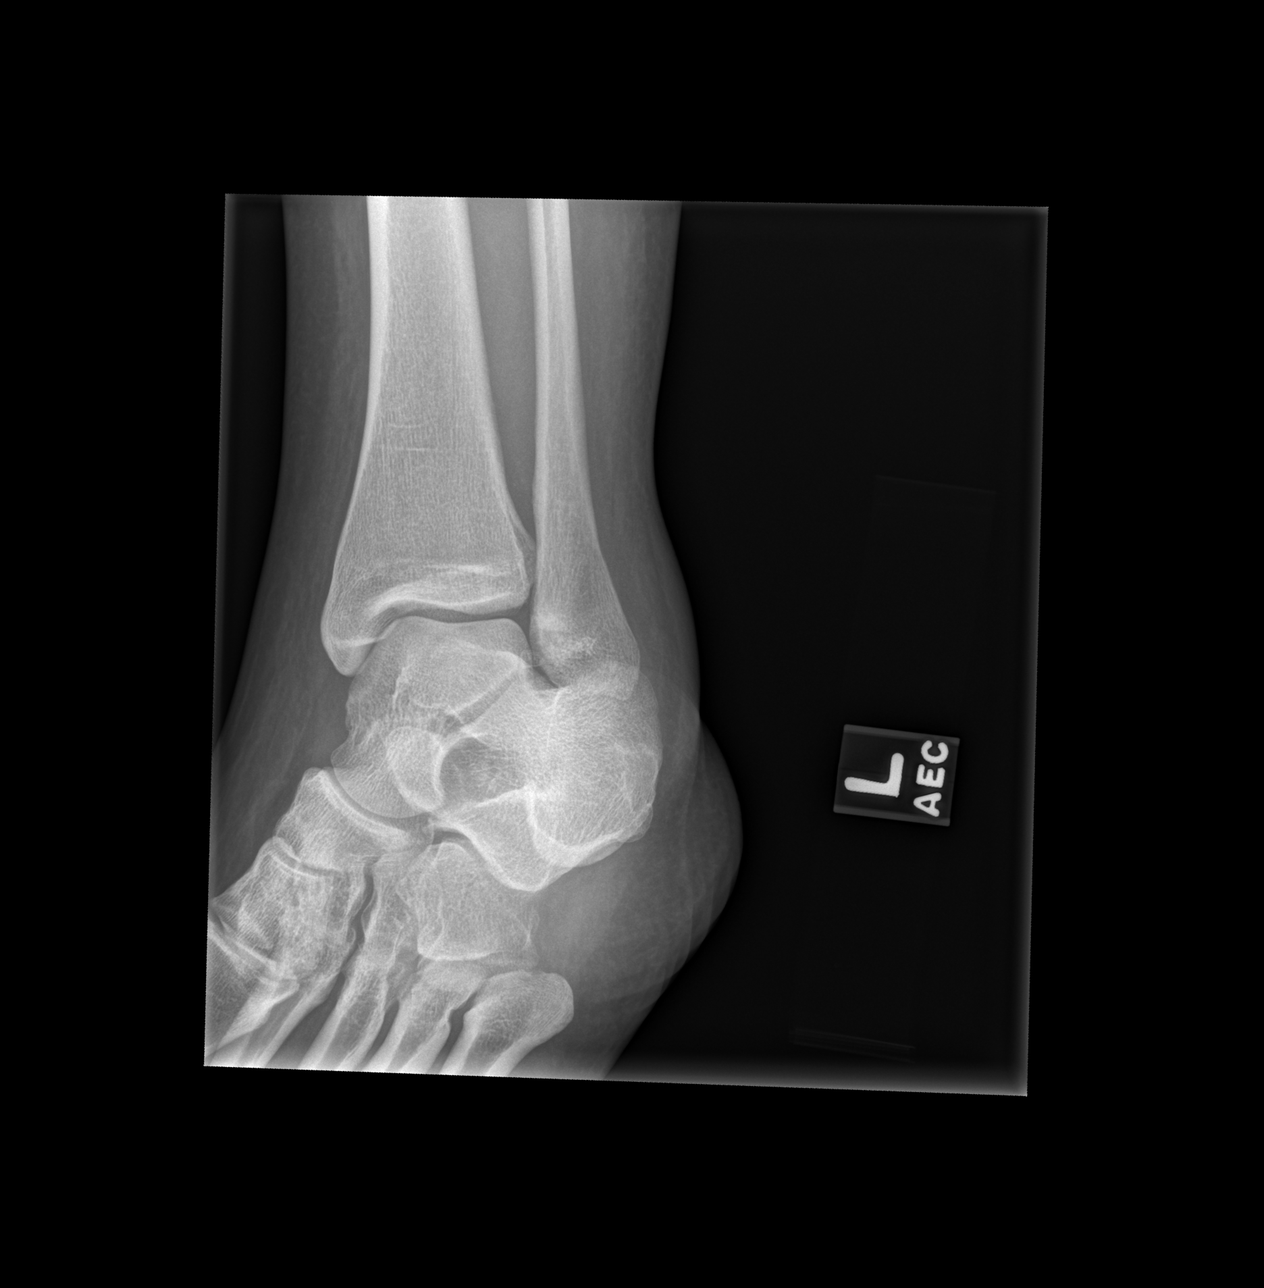

[x ankle lat left]
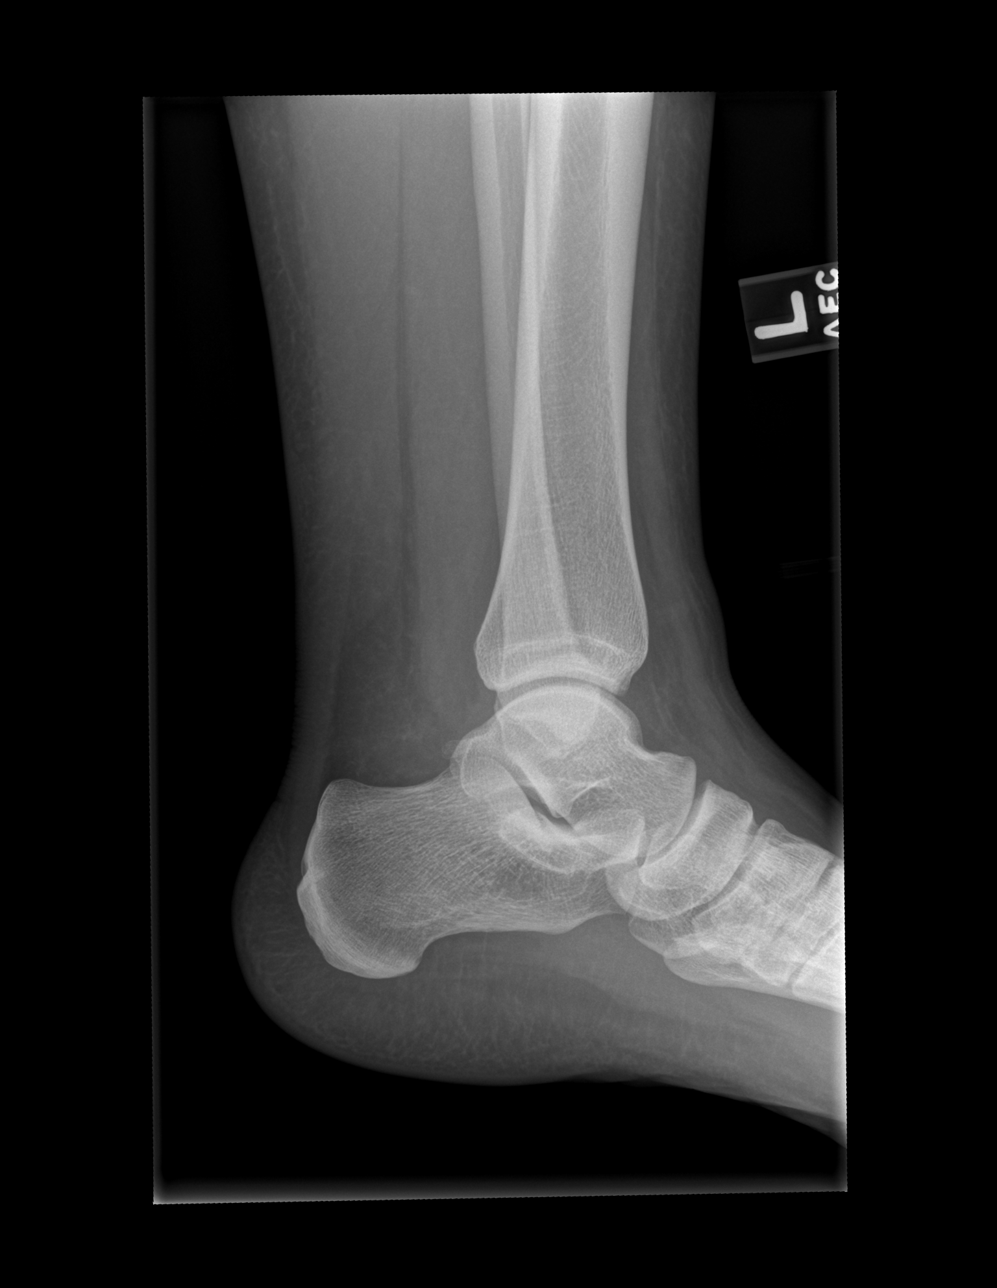

[3 of 3 positions shown; findings below may reference images not displayed]

FINDINGS: Diffuse soft tissue swelling.

Osseous mineralization normal.

Joint spaces preserved.

Tiny non fused rounded ossicle adjacent to the tip of lateral
malleolus, appears old.

No acute fracture, dislocation, or bone destruction.
IMPRESSION: No acute osseous abnormalities.

## 2020-08-20 IMAGING — US US OB TRANSVAGINAL
1 series · 14 of 28 positions shown · non-contrast
Comparison: CT of the abdomen and pelvis on 06/09/2012

CLINICAL DATA: LMP 11/19/2017. Assigned EDC of 08/26/2018. Patient
is 7 weeks 3 days. Bleeding for 2 days.

EXAM:
OBSTETRIC <14 WK US AND TRANSVAGINAL OB US
TECHNIQUE: Both transabdominal and transvaginal ultrasound examinations were
performed for complete evaluation of the gestation as well as the
maternal uterus, adnexal regions, and pelvic cul-de-sac.
Transvaginal technique was performed to assess early pregnancy.

[Series 1: us ob transvaginal · 0.20mm/px · 14 of 63 slices shown]
[im 3/63]
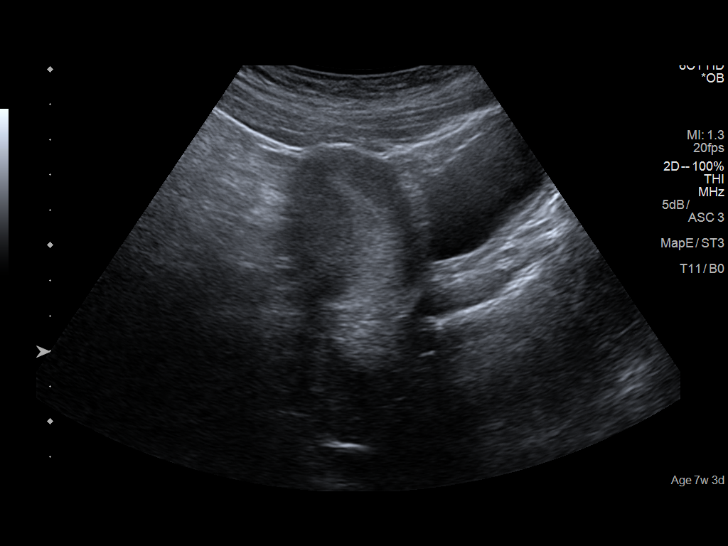
[im 7/63]
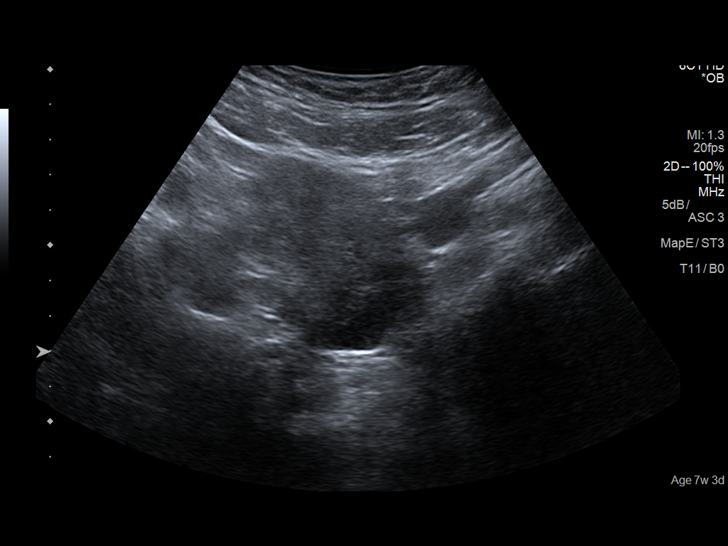
[im 12/63]
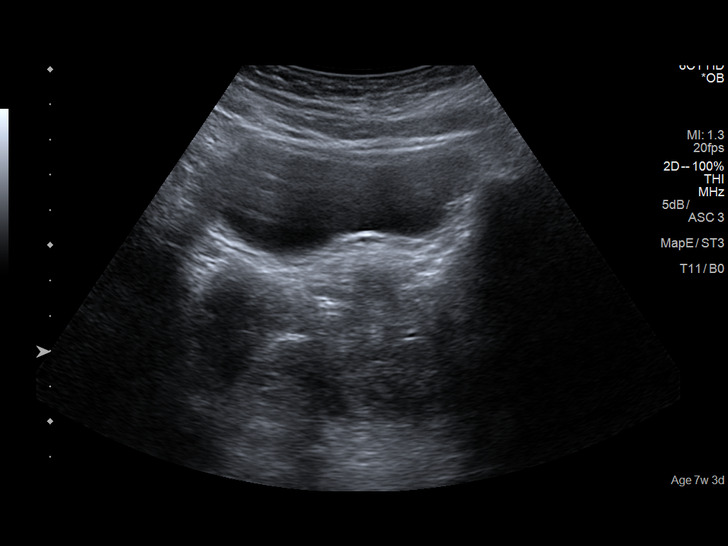
[im 17/63]
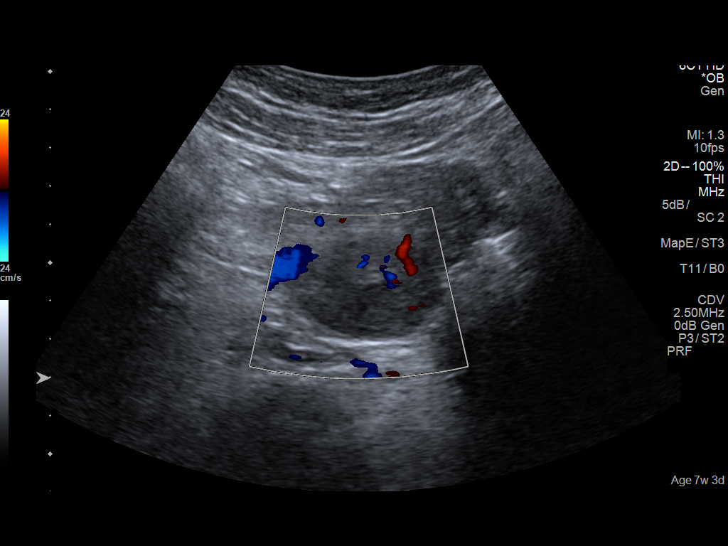
[im 21/63]
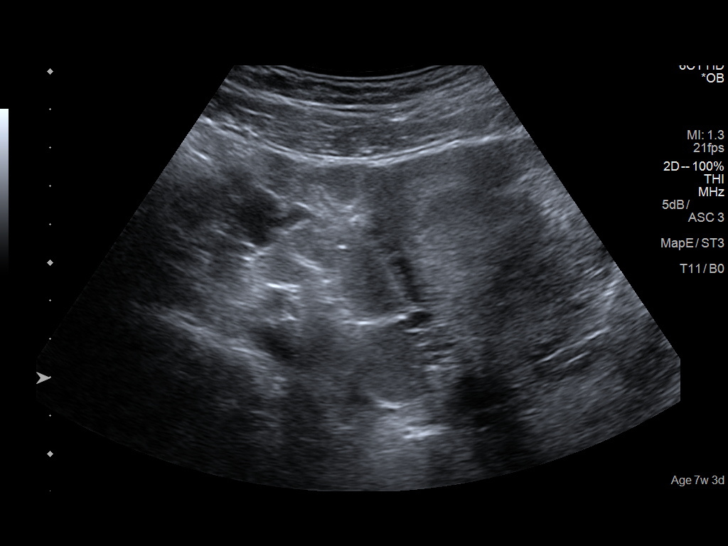
[im 26/63]
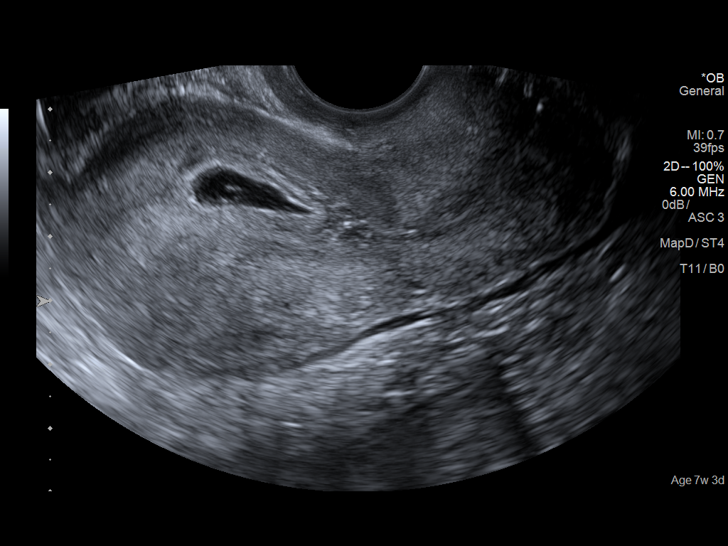
[im 30/63]
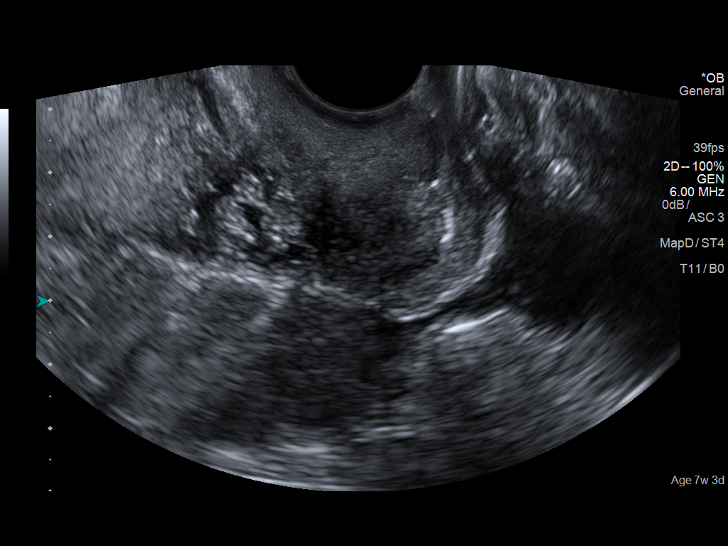
[im 35/63]
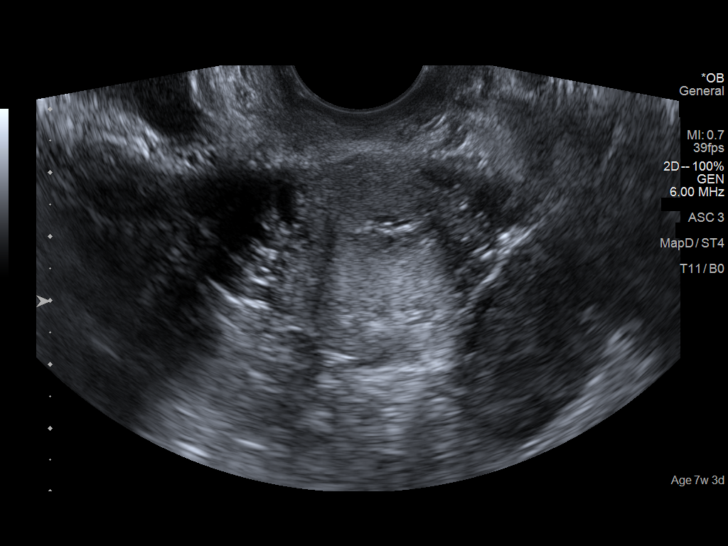
[im 40/63]
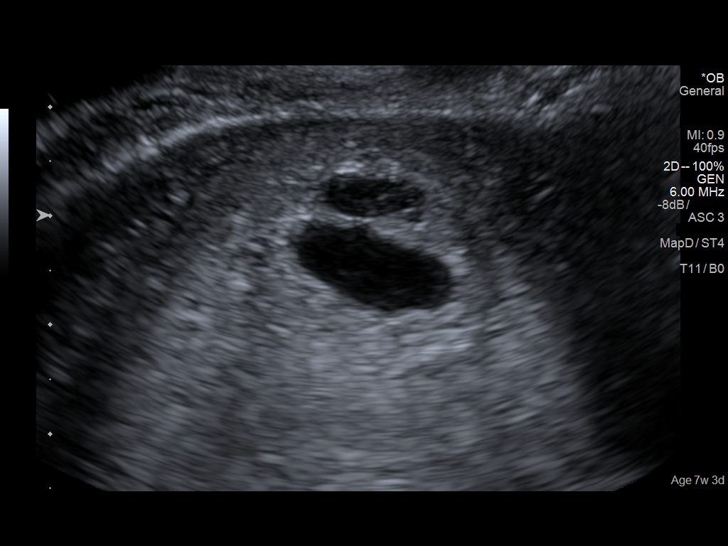
[im 44/63]
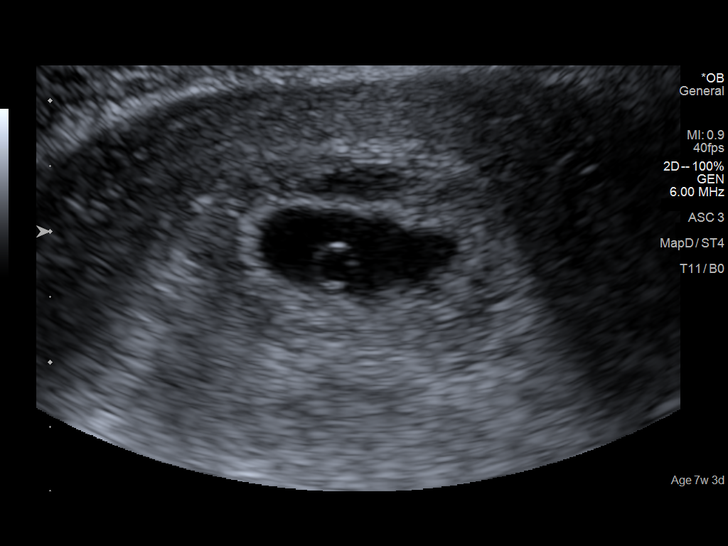
[im 49/63]
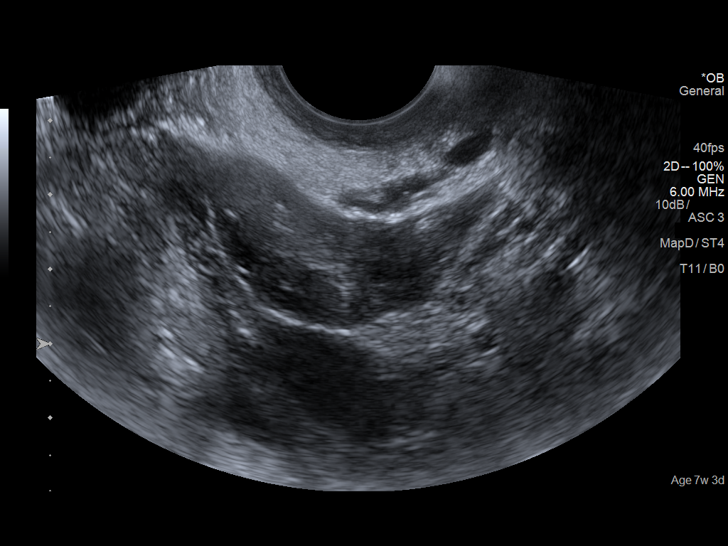
[im 53/63]
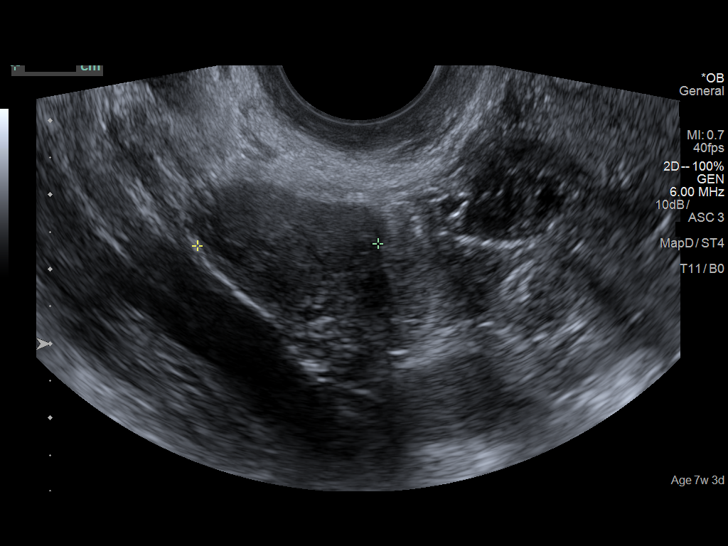
[im 58/63]
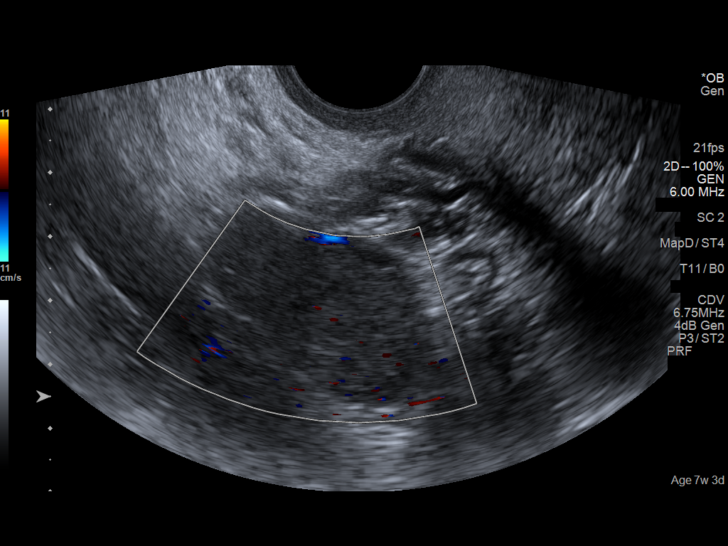
[im 63/63]
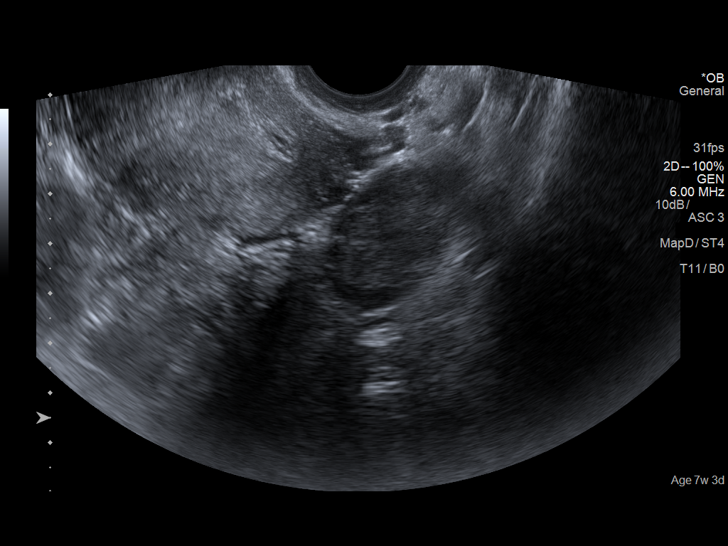

[14 of 28 positions shown; findings below may reference images not displayed]

FINDINGS: Intrauterine gestational sac: Single

Yolk sac:  Visualized.

Embryo:  Not Visualized.

Cardiac Activity:

Heart Rate:   bpm

MSD: 13.1 mm   6 w   1 d

Subchorionic hemorrhage:  Moderate

Maternal uterus/adnexae: Normal appearance of both ovaries. Small
amount free pelvic fluid.
IMPRESSION: Intrauterine gestational sac is present. Embryo is not yet
visualized. Follow-up ultrasound is recommended in 14 or more days
to document presence of fetal pole and for dating purposes.

Normal appearance of the ovaries.

## 2021-02-24 ENCOUNTER — Emergency Department (HOSPITAL_COMMUNITY)
Admission: EM | Admit: 2021-02-24 | Discharge: 2021-02-24 | Disposition: A | Discharge status: Home / Self Care | Payer: 59 | Attending: Emergency Medicine | Admitting: Emergency Medicine

## 2021-02-24 ENCOUNTER — Other Ambulatory Visit: Payer: Self-pay

## 2021-02-24 ENCOUNTER — Encounter (HOSPITAL_COMMUNITY): Payer: Self-pay | Admitting: Emergency Medicine

## 2021-02-24 DIAGNOSIS — K8050 Calculus of bile duct without cholangitis or cholecystitis without obstruction: Secondary | ICD-10-CM | POA: Diagnosis not present

## 2021-02-24 DIAGNOSIS — R7989 Other specified abnormal findings of blood chemistry: Secondary | ICD-10-CM | POA: Diagnosis not present

## 2021-02-24 DIAGNOSIS — R1010 Upper abdominal pain, unspecified: Secondary | ICD-10-CM | POA: Diagnosis present

## 2021-02-24 LAB — CBC WITH DIFFERENTIAL/PLATELET
Abs Immature Granulocytes: 0.01 10*3/uL (ref 0.00–0.07)
Basophils Absolute: 0 10*3/uL (ref 0.0–0.1)
Basophils Relative: 1 %
Eosinophils Absolute: 0.1 10*3/uL (ref 0.0–0.5)
Eosinophils Relative: 1 %
HCT: 41.3 % (ref 36.0–46.0)
Hemoglobin: 13.5 g/dL (ref 12.0–15.0)
Immature Granulocytes: 0 %
Lymphocytes Relative: 25 %
Lymphs Abs: 1.7 10*3/uL (ref 0.7–4.0)
MCH: 31.2 pg (ref 26.0–34.0)
MCHC: 32.7 g/dL (ref 30.0–36.0)
MCV: 95.4 fL (ref 80.0–100.0)
Monocytes Absolute: 0.7 10*3/uL (ref 0.1–1.0)
Monocytes Relative: 11 %
Neutro Abs: 4.4 10*3/uL (ref 1.7–7.7)
Neutrophils Relative %: 62 %
Platelets: 265 10*3/uL (ref 150–400)
RBC: 4.33 MIL/uL (ref 3.87–5.11)
RDW: 11.6 % (ref 11.5–15.5)
WBC: 6.9 10*3/uL (ref 4.0–10.5)
nRBC: 0 % (ref 0.0–0.2)

## 2021-02-24 LAB — URINALYSIS, ROUTINE W REFLEX MICROSCOPIC
Bilirubin Urine: NEGATIVE
Glucose, UA: NEGATIVE mg/dL
Ketones, ur: NEGATIVE mg/dL
Leukocytes,Ua: NEGATIVE
Nitrite: NEGATIVE
Protein, ur: 30 mg/dL — AB
RBC / HPF: >50 RBC/hpf — ABNORMAL HIGH (ref 0–5)
Specific Gravity, Urine: 1.027 (ref 1.005–1.030)
pH: 8 (ref 5.0–8.0)

## 2021-02-24 LAB — COMPREHENSIVE METABOLIC PANEL
ALT: 49 U/L — ABNORMAL HIGH (ref 0–44)
AST: 82 U/L — ABNORMAL HIGH (ref 15–41)
Albumin: 3.9 g/dL (ref 3.5–5.0)
Alkaline Phosphatase: 45 U/L (ref 38–126)
Anion gap: 9 (ref 5–15)
BUN: 11 mg/dL (ref 6–20)
CO2: 26 mmol/L (ref 22–32)
Calcium: 9.1 mg/dL (ref 8.9–10.3)
Chloride: 101 mmol/L (ref 98–111)
Creatinine, Ser: 0.87 mg/dL (ref 0.44–1.00)
GFR, Estimated: >60 mL/min (ref >60)
Glucose, Bld: 101 mg/dL — ABNORMAL HIGH (ref 70–99)
Potassium: 4.4 mmol/L (ref 3.5–5.1)
Sodium: 136 mmol/L (ref 135–145)
Total Bilirubin: 0.7 mg/dL (ref 0.3–1.2)
Total Protein: 7.3 g/dL (ref 6.5–8.1)

## 2021-02-24 LAB — I-STAT BETA HCG BLOOD, ED (MC, WL, AP ONLY): I-stat hCG, quantitative: <5 m[IU]/mL (ref <5)

## 2021-02-24 LAB — TROPONIN I (HIGH SENSITIVITY): Troponin I (High Sensitivity): <2 ng/L (ref <18)

## 2021-02-24 LAB — LIPASE, BLOOD: Lipase: 47 U/L (ref 11–51)

## 2021-02-24 MED ORDER — OXYCODONE-ACETAMINOPHEN 5-325 MG PO TABS: 1.0000 | ORAL_TABLET | Freq: Four times a day (QID) | ORAL | 0 refills | Status: DC | PRN

## 2021-02-24 MED ORDER — HYDROCODONE-ACETAMINOPHEN 5-325 MG PO TABS: 1.0000 | ORAL_TABLET | ORAL | 0 refills | Status: DC | PRN

## 2021-02-24 MED ORDER — HYDROCODONE-ACETAMINOPHEN 5-325 MG PO TABS
2.0000 | ORAL_TABLET | Freq: Once | ORAL | Status: AC
  Administered 2021-02-24: 2 via ORAL
  Filled 2021-02-24: qty 2

## 2021-02-24 MED ORDER — ONDANSETRON 4 MG PO TBDP: ORAL_TABLET | ORAL | 0 refills | Status: DC

## 2021-02-24 NOTE — ED Provider Triage Note (Signed)
Emergency Medicine Provider Triage Evaluation Note  DENESSA CAVAN , a 31 y.o. female  was evaluated in triage.  Pt complains of midsternal sharp chest pain that with diffuse abdominal pain with nausea.  Denies changes in bowel or bladder habits or shortness of breath.  Prior abdominal surgery..  Review of Systems  Positive: Nausea, chest pain vomiting, back pain Negative: Fever, changes in bowel or bladder habits  Physical Exam  BP (!) 118/98    Pulse 83    Temp 98.4 F (36.9 C) (Oral)    Resp 16    SpO2 100%  Gen:   Awake, no distress   Resp:  Normal effort  MSK:   Moves extremities without difficulty  Other:  Generalized abdominal tenderness, more so on the right upper quadrant  Medical Decision Making  Medically screening exam initiated at 7:44 PM.  Appropriate orders placed.  Varney Biles was informed that the remainder of the evaluation will be completed by another provider, this initial triage assessment does not replace that evaluation, and the importance of remaining in the ED until their evaluation is complete.     Jeannie Fend, PA-C 02/24/21 1950

## 2021-02-24 NOTE — Discharge Instructions (Addendum)
Call general surgery on Monday for soonest appointment.  Avoid fatty foods. Use Zofran as needed for nausea and vomiting. Use Tylenol or Motrin every 6 hours as needed for pain. For severe pain take norco or vicodin however realize they have the potential for addiction and it can make you sleepy and has tylenol in it.  No operating machinery while taking. Return for persistent vomiting, fevers, uncontrolled pain.

## 2021-02-24 NOTE — ED Provider Notes (Signed)
[facility] Provider Note   CSN: [id number] Arrival date & time: 02/24/21  1936     History  No chief complaint on file.   Kristina Day is a 31 y.o. female.  Patient presents with upper abdominal pain mild radiation to the back and left shoulder intermittent for the past month.  Got more significant this evening at 3 PM.  Unsure if just after eating fatty foods, after eating most foods.  No medical or surgical history.  No fevers or chills or vomiting.      Home Medications Prior to Admission medications   Medication Sig Start Date End Date Taking? Authorizing Provider  ondansetron (ZOFRAN-ODT) 4 MG disintegrating tablet 4mg  ODT q4 hours prn nausea/vomit 02/24/21  Yes Elnora Morrison, MD  oxyCODONE-acetaminophen (PERCOCET/ROXICET) 5-325 MG tablet Take 1-2 tablets by mouth every 6 (six) hours as needed for severe pain. 02/24/21  Yes Elnora Morrison, MD  cephALEXin (KEFLEX) 500 MG capsule Take 500 mg by mouth 4 (four) times daily. 03/26/19   [provider]  enoxaparin (LOVENOX) 40 MG/0.4ML injection INJECT 1 DOSE 4 HOURS PRIOR TO TRAVEL 03/26/19   [provider]  ibuprofen (ADVIL) 600 MG tablet Take 1 tablet (600 mg total) by mouth every 6 (six) hours as needed. 04/03/19   Melynda Ripple, MD  oxyCODONE-acetaminophen (PERCOCET/ROXICET) 5-325 MG tablet Take 1 tablet by mouth every 4 (four) hours as needed. 03/26/19   [provider]      Allergies    Patient has no known allergies.    Review of Systems   Review of Systems  Constitutional:  Negative for chills and fever.  HENT:  Negative for congestion.   Eyes:  Negative for visual disturbance.  Respiratory:  Negative for shortness of breath.   Cardiovascular:  Negative for chest pain.  Gastrointestinal:  Positive for abdominal pain and nausea. Negative for vomiting.  Genitourinary:  Negative for dysuria and flank pain.  Musculoskeletal:  Negative for back pain,  neck pain and neck stiffness.  Skin:  Negative for rash.  Neurological:  Negative for light-headedness and headaches.   Physical Exam Updated Vital Signs BP 119/72    Pulse 71    Temp 98.4 F (36.9 C) (Oral)    Resp (!) 26    Ht 5\' 6"  (1.676 m)    Wt 96.2 kg    SpO2 100%    BMI 34.22 kg/m  Physical Exam Vitals and nursing note reviewed.  Constitutional:      General: She is not in acute distress.    Appearance: She is well-developed.  HENT:     Head: Normocephalic and atraumatic.     Mouth/Throat:     Mouth: Mucous membranes are moist.  Eyes:     General:        Right eye: No discharge.        Left eye: No discharge.     Conjunctiva/sclera: Conjunctivae normal.  Neck:     Trachea: No tracheal deviation.  Cardiovascular:     Rate and Rhythm: Normal rate and regular rhythm.  Pulmonary:     Effort: Pulmonary effort is normal.     Breath sounds: Normal breath sounds.  Abdominal:     General: There is no distension.     Palpations: Abdomen is soft.     Tenderness: There is abdominal tenderness (RUQ). There is no guarding.  Musculoskeletal:     Cervical back: Normal range of motion and neck supple. No rigidity.  Skin:    General: Skin is warm.     Capillary Refill: Capillary refill takes less than 2 seconds.     Findings: No rash.  Neurological:     General: No focal deficit present.     Mental Status: She is alert.     Cranial Nerves: No cranial nerve deficit.  Psychiatric:     Comments: uncomfortable    ED Results / Procedures / Treatments   Labs (all labs ordered are listed, but only abnormal results are displayed) Labs Reviewed  COMPREHENSIVE METABOLIC PANEL - Abnormal; Notable for the following components:      Result Value   Glucose, Bld 101 (*)    AST 82 (*)    ALT 49 (*)    All other components within normal limits  URINALYSIS, ROUTINE W REFLEX MICROSCOPIC - Abnormal; Notable for the following components:   APPearance HAZY (*)    Hgb urine dipstick SMALL  (*)    Protein, ur 30 (*)    RBC / HPF >50 (*)    Bacteria, UA FEW (*)    All other components within normal limits  CBC WITH DIFFERENTIAL/PLATELET  LIPASE, BLOOD  I-STAT BETA HCG BLOOD, ED ([facility], [facility], [facility] ONLY)  TROPONIN I (HIGH SENSITIVITY)  TROPONIN I (HIGH SENSITIVITY)    EKG EKG Interpretation  Date/Time:  Friday February 24 2021 19:44:18 EST Ventricular Rate:  77 PR Interval:  140 QRS Duration: 72 QT Interval:  386 QTC Calculation: 436 R Axis:   90 Text Interpretation: Normal sinus rhythm Rightward axis Borderline ECG No previous ECGs available Confirmed by Elnora Morrison [phone]) on 02/24/2021 9:09:44 PM  Radiology No results found.  Procedures Procedures    Medications Ordered in ED Medications  HYDROcodone-acetaminophen (NORCO/VICODIN) 5-325 MG per tablet 2 tablet (2 tablets Oral Given 02/24/21 2146)    ED Course/ Medical Decision Making/ A&P                           Medical Decision Making Risk Prescription drug management.   Patient presents with intermittent worsening abdominal pain rating to the back.  With age, obesity and intermittent symptoms with eating clinical concern for biliary colic, other differentials include kidney stone, ulcer, reflux, colitis, other.  Blood work ordered and reviewed overall reassuring with normal white count normal hemoglobin however mild LFT elevation 82 and 49 respectively.  Bedside ultrasound performed showing multiple gallstones with shadowing no signs of cholecystitis no wall thickening.  With mild radiation to left shoulder and lower chest patient had EKG and troponin which were unremarkable.  Patient very low risk for ACS and gallbladder pathology more likely.  Pain meds ordered in the ER, patient is well-hydrated, patient has mother in the room for support.  Discussed avoiding fatty foods, pain meds ordered sent to the pharmacy, Zofran as needed and close follow-up with general surgery.  Reasons to return over the weekend  discussed for signs of cholecystitis or uncontrolled pain.  No indication for emergent admission at this time.          Final Clinical Impression(s) / ED Diagnoses Final diagnoses:  Biliary colic  LFT elevation    Rx / DC Orders ED Discharge Orders          Ordered    ondansetron (ZOFRAN-ODT) 4 MG disintegrating tablet        02/24/21 2231    HYDROcodone-acetaminophen (NORCO/VICODIN) 5-325 MG tablet  Every 4 hours PRN,   Status:  Discontinued        02/24/21 2231    oxyCODONE-acetaminophen (PERCOCET/ROXICET) 5-325 MG tablet  Every 6 hours PRN        02/24/21 2241              Elnora Morrison, MD 02/24/21 2247

## 2021-02-24 NOTE — ED Triage Notes (Signed)
Pt reported to ED with c/o pain th central chest, upper abdomen and back since approximately 3pm today. Pt reports hx of GERD, states pain is so severe she cannot eat anything. Tenderness to RUQ noted upon palpation.

## 2021-04-10 ENCOUNTER — Other Ambulatory Visit: Payer: Self-pay | Admitting: General Surgery

## 2021-04-10 DIAGNOSIS — R7401 Elevation of levels of liver transaminase levels: Secondary | ICD-10-CM

## 2021-04-10 DIAGNOSIS — K8020 Calculus of gallbladder without cholecystitis without obstruction: Secondary | ICD-10-CM

## 2021-04-10 DIAGNOSIS — R1011 Right upper quadrant pain: Secondary | ICD-10-CM

## 2021-04-24 ENCOUNTER — Other Ambulatory Visit (HOSPITAL_COMMUNITY): Payer: Self-pay | Admitting: General Surgery

## 2021-04-24 DIAGNOSIS — K8020 Calculus of gallbladder without cholecystitis without obstruction: Secondary | ICD-10-CM

## 2021-04-24 DIAGNOSIS — R7401 Elevation of levels of liver transaminase levels: Secondary | ICD-10-CM

## 2021-04-24 DIAGNOSIS — R1011 Right upper quadrant pain: Secondary | ICD-10-CM

## 2021-04-28 ENCOUNTER — Ambulatory Visit (HOSPITAL_COMMUNITY): Payer: 59

## 2021-04-28 ENCOUNTER — Encounter (HOSPITAL_COMMUNITY): Payer: Self-pay

## 2022-03-08 ENCOUNTER — Ambulatory Visit (HOSPITAL_COMMUNITY)
Admission: EM | Admit: 2022-03-08 | Discharge: 2022-03-08 | Disposition: A | Discharge status: Home / Self Care | Payer: BLUE CROSS/BLUE SHIELD | Attending: Internal Medicine | Admitting: Internal Medicine

## 2022-03-08 ENCOUNTER — Encounter (HOSPITAL_COMMUNITY): Payer: Self-pay | Admitting: *Deleted

## 2022-03-08 DIAGNOSIS — J020 Streptococcal pharyngitis: Secondary | ICD-10-CM

## 2022-03-08 LAB — POCT RAPID STREP A, ED / UC: Streptococcus, Group A Screen (Direct): NEGATIVE

## 2022-03-08 LAB — POCT INFECTIOUS MONO SCREEN, ED / UC: Mono Screen: NEGATIVE

## 2022-03-08 MED ORDER — PHENOL 1.4 % MT LIQD: 1.0000 | OROMUCOSAL | 0 refills | Status: AC | PRN

## 2022-03-08 MED ORDER — DEXAMETHASONE SODIUM PHOSPHATE 10 MG/ML IJ SOLN
INTRAMUSCULAR | Status: AC
  Filled 2022-03-08: qty 1

## 2022-03-08 MED ORDER — IBUPROFEN 800 MG PO TABS: 800.0000 mg | ORAL_TABLET | Freq: Three times a day (TID) | ORAL | 0 refills | Status: DC | PRN

## 2022-03-08 MED ORDER — AMOXICILLIN 500 MG PO CAPS: 500.0000 mg | ORAL_CAPSULE | Freq: Two times a day (BID) | ORAL | 0 refills | Status: AC

## 2022-03-08 MED ORDER — DEXAMETHASONE SODIUM PHOSPHATE 10 MG/ML IJ SOLN
10.0000 mg | Freq: Once | INTRAMUSCULAR | Status: AC
  Administered 2022-03-08: 10 mg via INTRAMUSCULAR

## 2022-03-08 NOTE — Discharge Instructions (Addendum)
Warm salt water gargle Please take medications as recommended Your strep test is negative Monospot test is negative Will send throat cultures and call you with recommendations if throat cultures are significant Please maintain adequate hydration Return to urgent care if you have worsening symptoms

## 2022-03-08 NOTE — ED Triage Notes (Signed)
Pt states she has had sore throat, the right side of her neck is swollen X 3 days. She has taken advil prn.

## 2022-03-09 NOTE — ED Provider Notes (Signed)
[facility]    CSN: [id number] Arrival date & time: 03/08/22  1117      History   Chief Complaint Chief Complaint  Patient presents with   Sore Throat   Otalgia    HPI Kristina Day is a 32 y.o. female comes to urgent care with 3-day history of severe sore throat, right-sided neck pain, swollen tonsils and pain on swallowing.  Patient's symptoms started fairly abruptly and has been persistent.  Ibuprofen did not help with the pain level.  Patient is a mouth breather and she endorses worsening pain usually in the mornings.  Patient currently scores a pain as 10 out of 10.  No fever or chills.  No sick contacts.  No shortness of breath or wheezing.  No vomiting or diarrhea. HPI  Past Medical History:  Diagnosis Date   STD (female)     Patient Active Problem List   Diagnosis Date Noted   Retained products of conception after delivery without hemorrhage 03/19/2018    Past Surgical History:  Procedure Laterality Date   DILATION AND CURETTAGE OF UTERUS     LIPOSUCTION AUOLOGOUS FAT TRANSFER TO BUTTOCKS  03/25/2019    OB History     Gravida  3   Para      Term      Preterm      AB  2   Living  0      SAB      IAB  1   Ectopic      Multiple      Live Births               Home Medications    Prior to Admission medications   Medication Sig Start Date End Date Taking? Authorizing Provider  amoxicillin (AMOXIL) 500 MG capsule Take 1 capsule (500 mg total) by mouth 2 (two) times daily for 10 days. 03/08/22 03/18/22 Yes Zorina Mallin, Myrene Galas, MD  ibuprofen (ADVIL) 800 MG tablet Take 1 tablet (800 mg total) by mouth every 8 (eight) hours as needed. 03/08/22  Yes Nakita Santerre, Myrene Galas, MD  norgestimate-ethinyl estradiol (ORTHO-CYCLEN) 0.25-35 MG-MCG tablet Take 1 tablet by mouth daily.   Yes [provider]  phenol (CHLORASEPTIC) 1.4 % LIQD Use as directed 1 spray in the mouth or throat as needed for throat irritation / pain. 03/08/22  Yes  Ceasia Elwell, Myrene Galas, MD    Family History Family History  Problem Relation Age of Onset   Diabetes Mother     Social History Social History   Tobacco Use   Smoking status: Some Days    Types: Cigarettes   Smokeless tobacco: Never   Tobacco comments:    rare  Vaping Use   Vaping Use: Never used  Substance Use Topics   Alcohol use: Yes    Comment: socially   Drug use: No     Allergies   Patient has no known allergies.   Review of Systems Review of Systems As per HPI  Physical Exam Triage Vital Signs ED Triage Vitals  Enc Vitals Group     BP 03/08/22 1307 123/84     Pulse Rate 03/08/22 1307 95     Resp 03/08/22 1307 18     Temp 03/08/22 1307 99.7 F (37.6 C)     Temp Source 03/08/22 1307 Oral     SpO2 03/08/22 1307 98 %     Weight --      Height --      Head  Circumference --      Peak Flow --      Pain Score 03/08/22 1305 10     Pain Loc --      Pain Edu? --      Excl. in [place]? --    No data found.  Updated Vital Signs BP 123/84 (BP Location: Left Arm)   Pulse 95   Temp 99.7 F (37.6 C) (Oral)   Resp 18   LMP 03/08/2022 (Exact Date)   SpO2 98%   Visual Acuity Right Eye Distance:   Left Eye Distance:   Bilateral Distance:    Right Eye Near:   Left Eye Near:    Bilateral Near:     Physical Exam Vitals and nursing note reviewed.  Constitutional:      General: She is in acute distress.     Appearance: She is ill-appearing.  HENT:     Mouth/Throat:     Mouth: Mucous membranes are moist.     Pharynx: Posterior oropharyngeal erythema present. No oropharyngeal exudate.     Tonsils: Tonsillar exudate present. 3+ on the right. 3+ on the left.  Cardiovascular:     Rate and Rhythm: Normal rate and regular rhythm.  Pulmonary:     Effort: Pulmonary effort is normal.     Breath sounds: Normal breath sounds.  Neurological:     Mental Status: She is alert.      UC Treatments / Results  Labs (all labs ordered are listed, but only abnormal  results are displayed) Labs Reviewed  POCT RAPID STREP A, ED / UC  POCT INFECTIOUS MONO SCREEN, ED / UC    EKG   Radiology No results found.  Procedures Procedures (including critical care time)  Medications Ordered in UC Medications  dexamethasone (DECADRON) injection 10 mg (10 mg Intramuscular Given 03/08/22 1329)    Initial Impression / Assessment and Plan / UC Course  I have reviewed the triage vital signs and the nursing notes.  Pertinent labs & imaging results that were available during my care of the patient were reviewed by me and considered in my medical decision making (see chart for details).     1.  Acute streptococcal pharyngitis: Dexamethasone 10 mg IM x 1 dose Point-of-care strep is negative Throat cultures have been sent Monotest is negative Given the clinical presentation and the physical exam, I will treat the patient empirically for strep pharyngitis. Amoxicillin 500 mg twice daily for 10 days Ibuprofen 800 mg as needed for pain Chloraseptic throat spray as needed for throat pain. I encouraged patient to increase oral fluid intake Warm salt water gargle to help with throat pain Will call patient with recommendations if labs are abnormal Return precautions given. Given theFinal Clinical Impressions(s) / UC Diagnoses   Final diagnoses:  Acute streptococcal pharyngitis     Discharge Instructions      Warm salt water gargle Please take medications as recommended Your strep test is negative Monospot test is negative Will send throat cultures and call you with recommendations if throat cultures are significant Please maintain adequate hydration Return to urgent care if you have worsening symptoms   ED Prescriptions     Medication Sig Dispense Auth. Provider   phenol (CHLORASEPTIC) 1.4 % LIQD Use as directed 1 spray in the mouth or throat as needed for throat irritation / pain. 118 mL Deana Krock, Myrene Galas, MD   ibuprofen (ADVIL) 800 MG tablet  Take 1 tablet (800 mg total) by mouth every 8 (eight) hours  as needed. 21 tablet Gabi Mcfate, Myrene Galas, MD   amoxicillin (AMOXIL) 500 MG capsule Take 1 capsule (500 mg total) by mouth 2 (two) times daily for 10 days. 20 capsule Koree Schopf, Myrene Galas, MD      PDMP not reviewed this encounter.   Chase Picket, MD 03/09/22 [phone]

## 2022-07-03 ENCOUNTER — Other Ambulatory Visit: Payer: Self-pay

## 2022-07-03 ENCOUNTER — Ambulatory Visit (HOSPITAL_COMMUNITY)
Admission: EM | Admit: 2022-07-03 | Discharge: 2022-07-03 | Disposition: A | Discharge status: Home / Self Care | Payer: BLUE CROSS/BLUE SHIELD | Attending: Family Medicine | Admitting: Family Medicine

## 2022-07-03 ENCOUNTER — Encounter (HOSPITAL_COMMUNITY): Payer: Self-pay | Admitting: Emergency Medicine

## 2022-07-03 DIAGNOSIS — G5603 Carpal tunnel syndrome, bilateral upper limbs: Secondary | ICD-10-CM | POA: Diagnosis not present

## 2022-07-03 DIAGNOSIS — R202 Paresthesia of skin: Secondary | ICD-10-CM | POA: Diagnosis not present

## 2022-07-03 MED ORDER — METHYLPREDNISOLONE ACETATE 80 MG/ML IJ SUSP
80.0000 mg | Freq: Once | INTRAMUSCULAR | Status: AC
  Administered 2022-07-03: 80 mg via INTRAMUSCULAR

## 2022-07-03 MED ORDER — METHYLPREDNISOLONE ACETATE 80 MG/ML IJ SUSP
INTRAMUSCULAR | Status: AC
  Filled 2022-07-03: qty 1

## 2022-07-03 NOTE — ED Triage Notes (Signed)
Pt c/o bilateral numbness and tingling x 3 weeks worse on the right side. Pt is AO x4 during triage no neuro deficit noticed.

## 2022-07-03 NOTE — Discharge Instructions (Signed)
You have been given an injection of methylprednisolone 80 mg.  This is to try to help inflammation in your wrist/carpal tunnel area.  Please call your primary care about this issue so they can help you decide what all evaluation you need

## 2022-07-03 NOTE — ED Provider Notes (Addendum)
[facility]    CSN: [id number] Arrival date & time: 07/03/22  1610      History   Chief Complaint Chief Complaint  Patient presents with   Numbness    Pt c/o bilateral numbness and tingling x 3 weeks worse on the right side. Pt is AO x4 during triage no neuro deficit noticed.    HPI Kristina Day is a 32 y.o. female.   HPI Here for bilateral hand numbness and tingling that extends up into both forearms.  Is been going on for about 3 weeks.  No injury and no fever or rash.  She has gotten braces for both arms, but it sounds like they are cock up splints.  She is on her menstrual cycle now  No allergies to medications.  She does have a primary care provider  Past Medical History:  Diagnosis Date   STD (female)     Patient Active Problem List   Diagnosis Date Noted   Retained products of conception after delivery without hemorrhage 03/19/2018    Past Surgical History:  Procedure Laterality Date   DILATION AND CURETTAGE OF UTERUS     LIPOSUCTION AUOLOGOUS FAT TRANSFER TO BUTTOCKS  03/25/2019    OB History     Gravida  3   Para      Term      Preterm      AB  2   Living  0      SAB      IAB  1   Ectopic      Multiple      Live Births               Home Medications    Prior to Admission medications   Medication Sig Start Date End Date Taking? Authorizing Provider  norgestimate-ethinyl estradiol (ORTHO-CYCLEN) 0.25-35 MG-MCG tablet Take 1 tablet by mouth daily.    [provider]  phenol (CHLORASEPTIC) 1.4 % LIQD Use as directed 1 spray in the mouth or throat as needed for throat irritation / pain. 03/08/22   Lamptey, Britta Mccreedy, MD    Family History Family History  Problem Relation Age of Onset   Diabetes Mother     Social History Social History   Tobacco Use   Smoking status: Some Days    Types: Cigarettes   Smokeless tobacco: Never   Tobacco comments:    rare  Vaping Use   Vaping Use: Never used   Substance Use Topics   Alcohol use: Yes    Comment: socially   Drug use: No     Allergies   Patient has no known allergies.   Review of Systems Review of Systems   Physical Exam Triage Vital Signs ED Triage Vitals  Enc Vitals Group     BP --      Pulse Rate 07/03/22 1642 82     Resp 07/03/22 1642 18     Temp 07/03/22 1642 98 F (36.7 C)     Temp Source 07/03/22 1642 Oral     SpO2 07/03/22 1642 98 %     Weight 07/03/22 1643 213 lb 13.5 oz (97 kg)     Height 07/03/22 1643 5\' 6"  (1.676 m)     Head Circumference --      Peak Flow --      Pain Score 07/03/22 1643 0     Pain Loc --      Pain Edu? --      Excl.  in GC? --    No data found.  Updated Vital Signs Pulse 82   Temp 98 F (36.7 C) (Oral)   Resp 18   Ht 5\' 6"  (1.676 m)   Wt 97 kg   LMP 06/30/2022   SpO2 98%   BMI 34.52 kg/m   Visual Acuity Right Eye Distance:   Left Eye Distance:   Bilateral Distance:    Right Eye Near:   Left Eye Near:    Bilateral Near:     Physical Exam Vitals reviewed.  Constitutional:      General: She is not in acute distress.    Appearance: She is not ill-appearing, toxic-appearing or diaphoretic.  HENT:     Mouth/Throat:     Mouth: Mucous membranes are moist.     Pharynx: No oropharyngeal exudate or posterior oropharyngeal erythema.  Eyes:     Extraocular Movements: Extraocular movements intact.     Pupils: Pupils are equal, round, and reactive to light.  Cardiovascular:     Rate and Rhythm: Normal rate and regular rhythm.     Heart sounds: No murmur heard. Pulmonary:     Effort: Pulmonary effort is normal.     Breath sounds: Normal breath sounds.  Musculoskeletal:     Cervical back: Neck supple.     Right lower leg: No edema.     Left lower leg: No edema.  Lymphadenopathy:     Cervical: No cervical adenopathy.  Skin:    Coloration: Skin is not jaundiced or pale.  Neurological:     General: No focal deficit present.     Mental Status: She is alert and  oriented to person, place, and time.     Comments: Phalen's is positive bilaterally.  Psychiatric:        Behavior: Behavior normal.      UC Treatments / Results  Labs (all labs ordered are listed, but only abnormal results are displayed) Labs Reviewed - No data to display  EKG   Radiology No results found.  Procedures Procedures (including critical care time)  Medications Ordered in UC Medications  methylPREDNISolone acetate (DEPO-MEDROL) injection 80 mg (has no administration in time range)    Initial Impression / Assessment and Plan / UC Course  I have reviewed the triage vital signs and the nursing notes.  Pertinent labs & imaging results that were available during my care of the patient were reviewed by me and considered in my medical decision making (see chart for details).        Depo-Medrol is given today to help with any inflammation.  We are supplying her with new wrist braces that hopefully will be more effective.  She will call her primary care about this issue so that she can be evaluated appropriately Final Clinical Impressions(s) / UC Diagnoses   Final diagnoses:  Paresthesia  Bilateral carpal tunnel syndrome     Discharge Instructions      You have been given an injection of methylprednisolone 80 mg.  This is to try to help inflammation in your wrist/carpal tunnel area.  Please call your primary care about this issue so they can help you decide what all evaluation you need     ED Prescriptions   None    PDMP not reviewed this encounter.   Zenia Resides, MD 07/03/22 1707    Zenia Resides, MD 07/03/22 [phone]

## 2022-07-04 ENCOUNTER — Telehealth (HOSPITAL_COMMUNITY): Payer: Self-pay

## 2022-11-17 ENCOUNTER — Encounter (HOSPITAL_COMMUNITY): Payer: Self-pay | Admitting: *Deleted

## 2022-11-17 ENCOUNTER — Other Ambulatory Visit: Payer: Self-pay

## 2022-11-17 ENCOUNTER — Emergency Department (HOSPITAL_COMMUNITY): Payer: BLUE CROSS/BLUE SHIELD

## 2022-11-17 ENCOUNTER — Emergency Department (HOSPITAL_COMMUNITY)
Admission: EM | Admit: 2022-11-17 | Discharge: 2022-11-17 | Disposition: A | Discharge status: Home / Self Care | Payer: BLUE CROSS/BLUE SHIELD | Attending: Emergency Medicine | Admitting: Emergency Medicine

## 2022-11-17 DIAGNOSIS — A5909 Other urogenital trichomoniasis: Secondary | ICD-10-CM | POA: Diagnosis not present

## 2022-11-17 DIAGNOSIS — K8050 Calculus of bile duct without cholangitis or cholecystitis without obstruction: Secondary | ICD-10-CM | POA: Insufficient documentation

## 2022-11-17 DIAGNOSIS — R1011 Right upper quadrant pain: Secondary | ICD-10-CM | POA: Diagnosis present

## 2022-11-17 LAB — COMPREHENSIVE METABOLIC PANEL
ALT: 12 U/L (ref 0–44)
AST: 12 U/L — ABNORMAL LOW (ref 15–41)
Albumin: 3.3 g/dL — ABNORMAL LOW (ref 3.5–5.0)
Alkaline Phosphatase: 49 U/L (ref 38–126)
Anion gap: 9 (ref 5–15)
BUN: 12 mg/dL (ref 6–20)
CO2: 27 mmol/L (ref 22–32)
Calcium: 9 mg/dL (ref 8.9–10.3)
Chloride: 99 mmol/L (ref 98–111)
Creatinine, Ser: 0.96 mg/dL (ref 0.44–1.00)
GFR, Estimated: >60 mL/min (ref >60)
Glucose, Bld: 139 mg/dL — ABNORMAL HIGH (ref 70–99)
Potassium: 3.8 mmol/L (ref 3.5–5.1)
Sodium: 135 mmol/L (ref 135–145)
Total Bilirubin: 0.4 mg/dL (ref 0.3–1.2)
Total Protein: 7.6 g/dL (ref 6.5–8.1)

## 2022-11-17 LAB — CBC
HCT: 33.2 % — ABNORMAL LOW (ref 36.0–46.0)
Hemoglobin: 10.8 g/dL — ABNORMAL LOW (ref 12.0–15.0)
MCH: 30.6 pg (ref 26.0–34.0)
MCHC: 32.5 g/dL (ref 30.0–36.0)
MCV: 94.1 fL (ref 80.0–100.0)
Platelets: 277 10*3/uL (ref 150–400)
RBC: 3.53 MIL/uL — ABNORMAL LOW (ref 3.87–5.11)
RDW: 12 % (ref 11.5–15.5)
WBC: 8 10*3/uL (ref 4.0–10.5)
nRBC: 0 % (ref 0.0–0.2)

## 2022-11-17 LAB — URINALYSIS, ROUTINE W REFLEX MICROSCOPIC
Bilirubin Urine: NEGATIVE
Glucose, UA: NEGATIVE mg/dL
Ketones, ur: NEGATIVE mg/dL
Nitrite: NEGATIVE
Protein, ur: 100 mg/dL — AB
Specific Gravity, Urine: 1.025 (ref 1.005–1.030)
pH: 5 (ref 5.0–8.0)

## 2022-11-17 LAB — WET PREP, GENITAL
Clue Cells Wet Prep HPF POC: NONE SEEN
Sperm: NONE SEEN
WBC, Wet Prep HPF POC: <10 (ref <10)
Yeast Wet Prep HPF POC: NONE SEEN

## 2022-11-17 LAB — HCG, SERUM, QUALITATIVE: Preg, Serum: NEGATIVE

## 2022-11-17 LAB — LIPASE, BLOOD: Lipase: 21 U/L (ref 11–51)

## 2022-11-17 LAB — RPR: RPR Ser Ql: NONREACTIVE

## 2022-11-17 LAB — HIV ANTIBODY (ROUTINE TESTING W REFLEX): HIV Screen 4th Generation wRfx: NONREACTIVE

## 2022-11-17 MED ORDER — OXYCODONE-ACETAMINOPHEN 5-325 MG PO TABS: 1.0000 | ORAL_TABLET | Freq: Four times a day (QID) | ORAL | 0 refills | Status: DC | PRN

## 2022-11-17 MED ORDER — ONDANSETRON 4 MG PO TBDP: 4.0000 mg | ORAL_TABLET | Freq: Three times a day (TID) | ORAL | 0 refills | Status: AC | PRN

## 2022-11-17 MED ORDER — DOXYCYCLINE HYCLATE 100 MG PO CAPS: 100.0000 mg | ORAL_CAPSULE | Freq: Two times a day (BID) | ORAL | 0 refills | Status: AC

## 2022-11-17 MED ORDER — OXYCODONE-ACETAMINOPHEN 5-325 MG PO TABS
1.0000 | ORAL_TABLET | Freq: Once | ORAL | Status: AC
  Administered 2022-11-17: 1 via ORAL
  Filled 2022-11-17: qty 1

## 2022-11-17 MED ORDER — METRONIDAZOLE 500 MG PO TABS: 500.0000 mg | ORAL_TABLET | Freq: Two times a day (BID) | ORAL | 0 refills | Status: DC

## 2022-11-17 NOTE — ED Notes (Signed)
US tech at bedside

## 2022-11-17 NOTE — ED Provider Notes (Signed)
[place] [facility] AT [facility] Provider Note   CSN: [id number] Arrival date & time: 11/17/22  8295     History  Chief Complaint  Patient presents with   Abdominal Pain    Kristina Day is a 32 y.o. female.  The history is provided by the patient and medical records.  Abdominal Pain Kristina Day is a 32 y.o. female who presents to the Emergency Department complaining of gallbladder pain.  She reports pain across her entire upper abdomen greatest over the right upper quadrant that started at 10 PM.  She has experienced similar episodes in the past, and they normally respond to a hot bath.  Today she tried heat and it did not help.  She also took Tylenol, which did not help.  No associated fever, nausea, vomiting.  She did have some temporary chest discomfort and discomfort in her bilateral shoulders that are now gone.  No difficulty breathing, leg swelling or pain.  No dysuria.  No vaginal discharge.  She does request STI testing "just because."    Home Medications Prior to Admission medications   Medication Sig Start Date End Date Taking? Authorizing Provider  doxycycline (VIBRAMYCIN) 100 MG capsule Take 1 capsule (100 mg total) by mouth 2 (two) times daily. 11/17/22  Yes Tilden Fossa, MD  metroNIDAZOLE (FLAGYL) 500 MG tablet Take 1 tablet (500 mg total) by mouth 2 (two) times daily. 11/17/22  Yes Tilden Fossa, MD  oxyCODONE-acetaminophen (PERCOCET/ROXICET) 5-325 MG tablet Take 1 tablet by mouth every 6 (six) hours as needed for severe pain (pain score 7-10). 11/17/22  Yes Tilden Fossa, MD  norgestimate-ethinyl estradiol (ORTHO-CYCLEN) 0.25-35 MG-MCG tablet Take 1 tablet by mouth daily.    [provider]  phenol (CHLORASEPTIC) 1.4 % LIQD Use as directed 1 spray in the mouth or throat as needed for throat irritation / pain. 03/08/22   Lamptey, Britta Mccreedy, MD      Allergies    Patient has no known allergies.    Review of Systems    Review of Systems  Gastrointestinal:  Positive for abdominal pain.  All other systems reviewed and are negative.   Physical Exam Updated Vital Signs BP 118/60 (BP Location: Right Arm)   Pulse 77   Temp 98.3 F (36.8 C)   Resp 18   Ht 5\' 6"  (1.676 m)   Wt 97 kg   LMP 11/09/2022   SpO2 95%   BMI 34.52 kg/m  Physical Exam Vitals and nursing note reviewed.  Constitutional:      Appearance: She is well-developed.  HENT:     Head: Normocephalic and atraumatic.  Cardiovascular:     Rate and Rhythm: Normal rate and regular rhythm.     Heart sounds: No murmur heard. Pulmonary:     Effort: Pulmonary effort is normal. No respiratory distress.     Breath sounds: Normal breath sounds.  Abdominal:     Palpations: Abdomen is soft.     Tenderness: There is no guarding or rebound.     Comments: Mild RUQ tenderness  Genitourinary:    Comments: Small amount of white vaginal discharge, no CMT. Musculoskeletal:        General: No tenderness or edema.  Skin:    General: Skin is warm and dry.  Neurological:     Mental Status: She is alert and oriented to person, place, and time.  Psychiatric:        Mood and Affect: Mood and affect normal.  Behavior: Behavior normal.     ED Results / Procedures / Treatments   Labs (all labs ordered are listed, but only abnormal results are displayed) Labs Reviewed  WET PREP, GENITAL - Abnormal; Notable for the following components:      Result Value   Trich, Wet Prep PRESENT (*)    All other components within normal limits  COMPREHENSIVE METABOLIC PANEL - Abnormal; Notable for the following components:   Glucose, Bld 139 (*)    Albumin 3.3 (*)    AST 12 (*)    All other components within normal limits  CBC - Abnormal; Notable for the following components:   RBC 3.53 (*)    Hemoglobin 10.8 (*)    HCT 33.2 (*)    All other components within normal limits  URINALYSIS, ROUTINE W REFLEX MICROSCOPIC - Abnormal; Notable for the following  components:   Color, Urine AMBER (*)    APPearance CLOUDY (*)    Hgb urine dipstick SMALL (*)    Protein, ur 100 (*)    Leukocytes,Ua SMALL (*)    Bacteria, UA MANY (*)    All other components within normal limits  LIPASE, BLOOD  HCG, SERUM, QUALITATIVE  RPR  HIV ANTIBODY (ROUTINE TESTING W REFLEX)  GC/CHLAMYDIA PROBE AMP ([place]) NOT AT [facility]    EKG None  Radiology US Abdomen Limited RUQ (LIVER/GB)  Result Date: 11/17/2022 CLINICAL DATA:  Right upper quadrant abdominal pain EXAM: ULTRASOUND ABDOMEN LIMITED RIGHT UPPER QUADRANT COMPARISON:  06/09/2012 FINDINGS: Gallbladder: Multiple shadowing gallstones. Distended gallbladder without wall thickening or focal tenderness. Common bile duct: Diameter: 4 mm Liver: No focal lesion identified. Within normal limits in parenchymal echogenicity. Portal vein is patent on color Doppler imaging with normal direction of blood flow towards the liver. IMPRESSION: Multiple gallstones.  No indication of acute cholecystitis. Electronically Signed   By: Tiburcio Pea M.D.   On: 11/17/2022 07:06    Procedures Procedures    Medications Ordered in ED Medications  oxyCODONE-acetaminophen (PERCOCET/ROXICET) 5-325 MG per tablet 1 tablet (1 tablet Oral Given 11/17/22 0117)    ED Course/ Medical Decision Making/ A&P                                 Medical Decision Making Amount and/or Complexity of Data Reviewed Labs: ordered. Radiology: ordered.  Risk Prescription drug management.   Patient here for eval ration of right upper quadrant pain, history of biliary colic and this feels the same.  Also requesting STI testing.  Right upper quadrant ultrasound does demonstrate cholelithiasis without any evidence of cholecystitis or obstruction.  Abdominal pain improved after medications received in the emergency department.  CBC with mild anemia, no leukocytosis.  CMP is unremarkable.  UA has significant contaminant with epithelials, she does not  currently have a urinary symptoms, would not treat for UTI at this time.  Pelvic examination with vaginal discharge, she is positive for trichomonas.  Patient denies any significant symptoms in terms of STD.  Will treat for cervicitis given her wet prep findings, concern for possible coinfection with chlamydia, will treat with Doxy as well.  No evidence of PID or tubo-ovarian abscess.  Discussed with patient home care for trichomonas as well as biliary colic.  Will prescribe pain medication that she may take as needed.  Discussed outpatient follow-up and return precautions.        Final Clinical Impression(s) / ED Diagnoses Final diagnoses:  Biliary colic  Trichomonal cervicitis    Rx / DC Orders ED Discharge Orders          Ordered    oxyCODONE-acetaminophen (PERCOCET/ROXICET) 5-325 MG tablet  Every 6 hours PRN        11/17/22 0730    metroNIDAZOLE (FLAGYL) 500 MG tablet  2 times daily        11/17/22 0730    doxycycline (VIBRAMYCIN) 100 MG capsule  2 times daily        11/17/22 0730              Tilden Fossa, MD 11/17/22 [phone]

## 2022-11-17 NOTE — ED Triage Notes (Signed)
The pt is c/o abd pain  no nausea vomiting or diarrhea she was diagnosed with gallstones earlier in the year

## 2022-11-19 LAB — GC/CHLAMYDIA PROBE AMP (~~LOC~~) NOT AT ARMC
Chlamydia: NEGATIVE
Comment: NEGATIVE
Comment: NORMAL
Neisseria Gonorrhea: NEGATIVE

## 2023-03-17 ENCOUNTER — Emergency Department (HOSPITAL_COMMUNITY)

## 2023-03-17 ENCOUNTER — Other Ambulatory Visit: Payer: Self-pay

## 2023-03-17 ENCOUNTER — Emergency Department (HOSPITAL_COMMUNITY)
Admission: EM | Admit: 2023-03-17 | Discharge: 2023-03-17 | Disposition: A | Discharge status: Home / Self Care | Attending: Emergency Medicine | Admitting: Emergency Medicine

## 2023-03-17 ENCOUNTER — Encounter (HOSPITAL_COMMUNITY): Payer: Self-pay

## 2023-03-17 DIAGNOSIS — K8020 Calculus of gallbladder without cholecystitis without obstruction: Secondary | ICD-10-CM | POA: Insufficient documentation

## 2023-03-17 DIAGNOSIS — R1011 Right upper quadrant pain: Secondary | ICD-10-CM | POA: Diagnosis present

## 2023-03-17 LAB — COMPREHENSIVE METABOLIC PANEL
ALT: 23 U/L (ref 0–44)
ALT: 13 U/L (ref 0–44)
AST: 43 U/L — ABNORMAL HIGH (ref 15–41)
AST: 13 U/L — ABNORMAL LOW (ref 15–41)
Albumin: 3.6 g/dL (ref 3.5–5.0)
Albumin: 3.6 g/dL (ref 3.5–5.0)
Alkaline Phosphatase: 32 U/L — ABNORMAL LOW (ref 38–126)
Alkaline Phosphatase: 31 U/L — ABNORMAL LOW (ref 38–126)
Anion gap: 10 (ref 5–15)
Anion gap: 7 (ref 5–15)
BUN: 14 mg/dL (ref 6–20)
BUN: 13 mg/dL (ref 6–20)
CO2: 25 mmol/L (ref 22–32)
CO2: 26 mmol/L (ref 22–32)
Calcium: 9 mg/dL (ref 8.9–10.3)
Calcium: 8.8 mg/dL — ABNORMAL LOW (ref 8.9–10.3)
Chloride: 101 mmol/L (ref 98–111)
Chloride: 102 mmol/L (ref 98–111)
Creatinine, Ser: 0.77 mg/dL (ref 0.44–1.00)
Creatinine, Ser: 0.78 mg/dL (ref 0.44–1.00)
GFR, Estimated: >60 mL/min (ref >60)
GFR, Estimated: >60 mL/min (ref >60)
Glucose, Bld: 93 mg/dL (ref 70–99)
Glucose, Bld: 94 mg/dL (ref 70–99)
Potassium: 5.3 mmol/L — ABNORMAL HIGH (ref 3.5–5.1)
Potassium: 4.1 mmol/L (ref 3.5–5.1)
Sodium: 136 mmol/L (ref 135–145)
Sodium: 135 mmol/L (ref 135–145)
Total Bilirubin: 0.3 mg/dL (ref 0.0–1.2)
Total Bilirubin: 0.4 mg/dL (ref 0.0–1.2)
Total Protein: 6.2 g/dL — ABNORMAL LOW (ref 6.5–8.1)
Total Protein: 6.4 g/dL — ABNORMAL LOW (ref 6.5–8.1)

## 2023-03-17 LAB — CBC WITH DIFFERENTIAL/PLATELET
Abs Immature Granulocytes: 0 10*3/uL (ref 0.00–0.07)
Basophils Absolute: 0 10*3/uL (ref 0.0–0.1)
Basophils Relative: 1 %
Eosinophils Absolute: 0.1 10*3/uL (ref 0.0–0.5)
Eosinophils Relative: 2 %
HCT: 36.2 % (ref 36.0–46.0)
Hemoglobin: 12.2 g/dL (ref 12.0–15.0)
Immature Granulocytes: 0 %
Lymphocytes Relative: 60 %
Lymphs Abs: 2.9 10*3/uL (ref 0.7–4.0)
MCH: 31.4 pg (ref 26.0–34.0)
MCHC: 33.7 g/dL (ref 30.0–36.0)
MCV: 93.1 fL (ref 80.0–100.0)
Monocytes Absolute: 0.5 10*3/uL (ref 0.1–1.0)
Monocytes Relative: 10 %
Neutro Abs: 1.3 10*3/uL — ABNORMAL LOW (ref 1.7–7.7)
Neutrophils Relative %: 27 %
Platelets: 213 10*3/uL (ref 150–400)
RBC: 3.89 MIL/uL (ref 3.87–5.11)
RDW: 13.3 % (ref 11.5–15.5)
WBC: 4.9 10*3/uL (ref 4.0–10.5)
nRBC: 0 % (ref 0.0–0.2)

## 2023-03-17 LAB — HCG, SERUM, QUALITATIVE: Preg, Serum: NEGATIVE

## 2023-03-17 LAB — LIPASE, BLOOD: Lipase: 27 U/L (ref 11–51)

## 2023-03-17 MED ORDER — OXYCODONE HCL 5 MG PO TABS: 5.0000 mg | ORAL_TABLET | ORAL | 0 refills | Status: AC | PRN

## 2023-03-17 MED ORDER — MORPHINE SULFATE (PF) 4 MG/ML IV SOLN
4.0000 mg | Freq: Once | INTRAVENOUS | Status: AC
  Administered 2023-03-17: 4 mg via INTRAVENOUS
  Filled 2023-03-17: qty 1

## 2023-03-17 NOTE — ED Provider Notes (Signed)
 [name] [facility] AT [facility] Provider Note   CSN: [id number] Arrival date & time: 03/17/23  4403     History  Chief Complaint  Patient presents with   Abdominal Pain    Kristina Day is a 33 y.o. female.  HPI 33 year old female with a history of cholelithiasis presents with right upper quadrant abdominal pain.  For the past week or so she has noticed some on and off pain.  However the pain woke her up from sleep this morning and has been persistent ever since.  She is also having pain in both shoulders.  The pain is in her right upper abdomen and chest.  This is how it has felt before with gallbladder pain.  She denies fevers or vomiting.  No shortness of breath or cough.  The pain is currently a 10/10.  Home Medications Prior to Admission medications   Medication Sig Start Date End Date Taking? Authorizing Provider  oxyCODONE (ROXICODONE) 5 MG immediate release tablet Take 1 tablet (5 mg total) by mouth every 4 (four) hours as needed for severe pain (pain score 7-10). 03/17/23  Yes Pricilla Loveless, MD  doxycycline (VIBRAMYCIN) 100 MG capsule Take 1 capsule (100 mg total) by mouth 2 (two) times daily. 11/17/22   Tilden Fossa, MD  metroNIDAZOLE (FLAGYL) 500 MG tablet Take 1 tablet (500 mg total) by mouth 2 (two) times daily. 11/17/22   Tilden Fossa, MD  norgestimate-ethinyl estradiol (ORTHO-CYCLEN) 0.25-35 MG-MCG tablet Take 1 tablet by mouth daily.    [provider]  ondansetron (ZOFRAN-ODT) 4 MG disintegrating tablet Take 1 tablet (4 mg total) by mouth every 8 (eight) hours as needed. 11/17/22   Tilden Fossa, MD  phenol (CHLORASEPTIC) 1.4 % LIQD Use as directed 1 spray in the mouth or throat as needed for throat irritation / pain. 03/08/22   Lamptey, Britta Mccreedy, MD      Allergies    Patient has no known allergies.    Review of Systems   Review of Systems  Constitutional:  Negative for fever.  Respiratory:  Negative for cough and  shortness of breath.   Cardiovascular:  Positive for chest pain.  Gastrointestinal:  Positive for abdominal pain. Negative for vomiting.  Musculoskeletal:  Positive for back pain.    Physical Exam Updated Vital Signs BP (!) 128/94   Pulse 80   Temp 98.9 F (37.2 C) (Oral)   Resp 11   Ht 5\' 6"  (1.676 m)   Wt 80.7 kg   LMP 02/18/2023 (Approximate)   SpO2 100%   BMI 28.73 kg/m  Physical Exam Vitals and nursing note reviewed.  Constitutional:      General: She is not in acute distress.    Appearance: She is well-developed. She is not ill-appearing or diaphoretic.  HENT:     Head: Normocephalic and atraumatic.  Cardiovascular:     Rate and Rhythm: Normal rate and regular rhythm.     Heart sounds: Normal heart sounds.  Pulmonary:     Effort: Pulmonary effort is normal.     Breath sounds: Normal breath sounds.  Abdominal:     Palpations: Abdomen is soft.     Tenderness: There is abdominal tenderness in the right upper quadrant.  Skin:    General: Skin is warm and dry.  Neurological:     Mental Status: She is alert.     ED Results / Procedures / Treatments   Labs (all labs ordered are listed, but only abnormal results are  displayed) Labs Reviewed  CBC WITH DIFFERENTIAL/PLATELET - Abnormal; Notable for the following components:      Result Value   Neutro Abs 1.3 (*)    All other components within normal limits  COMPREHENSIVE METABOLIC PANEL - Abnormal; Notable for the following components:   Potassium 5.3 (*)    Total Protein 6.2 (*)    AST 43 (*)    Alkaline Phosphatase 32 (*)    All other components within normal limits  COMPREHENSIVE METABOLIC PANEL - Abnormal; Notable for the following components:   Calcium 8.8 (*)    Total Protein 6.4 (*)    AST 13 (*)    Alkaline Phosphatase 31 (*)    All other components within normal limits  HCG, SERUM, QUALITATIVE  LIPASE, BLOOD  URINALYSIS, ROUTINE W REFLEX MICROSCOPIC    EKG EKG Interpretation Date/Time:  Sunday  March 17 2023 09:00:26 EST Ventricular Rate:  84 PR Interval:  141 QRS Duration:  82 QT Interval:  352 QTC Calculation: 416 R Axis:   93  Text Interpretation: Sinus rhythm Borderline right axis deviation no acute ST/T changes similar to Feb 2023 Confirmed by Luisdaniel Kenton ([id number]) on 03/17/2023 9:28:41 AM  Radiology US Abdomen Limited RUQ (LIVER/GB) Result Date: 03/17/2023 CLINICAL DATA:  Right upper quadrant abdominal pain EXAM: ULTRASOUND ABDOMEN LIMITED RIGHT UPPER QUADRANT COMPARISON:  11/17/2022 FINDINGS: Gallbladder: Gallstones shadow most of the gallbladder without evidence of over distension. No focal tenderness or pericholecystic fluid, the anterior wall measures 2-3 mm. Common bile duct: Diameter: 6 mm, upper normal.  Where visualized, no filling defect. Liver: No focal lesion identified. Within normal limits in parenchymal echogenicity. Portal vein is patent on color Doppler imaging with normal direction of blood flow towards the liver. IMPRESSION: 1. Multiple gallstones. No tenderness or pericholecystic edema to implicate acute cholecystitis. 2. Upper normal CBD diameter. Electronically Signed   By: Jonathan  Watts M.D.   On: 03/17/2023 09:50    Procedures Procedures    Medications Ordered in ED Medications  morphine (PF) 4 MG/ML injection 4 mg (4 mg Intravenous Given 03/17/23 0853)    ED Course/ Medical Decision Making/ A&P                                 Medical Decision Making Amount and/or Complexity of Data Reviewed Labs: ordered.    Details: Normal WBC Radiology: ordered and independent interpretation performed.    Details: Cholelithiasis ECG/medicine tests: ordered and independent interpretation performed.    Details: No ischemia  Risk Prescription drug management.   Patient presents with recurrent right upper quadrant pain.  Controlled with a single dose of morphine.  Feeling a lot better.  Her workup otherwise is not concerning for cholecystitis and I think  this is biliary colic.  Will give the brief course of oxycodone for pain control but I do not think this is needing an emergent cholecystectomy.  We discussed return precautions but otherwise she appears stable for discharge with outpatient surgical follow-up.        Final Clinical Impression(s) / ED Diagnoses Final diagnoses:  Calculus of gallbladder without cholecystitis without obstruction    Rx / DC Orders ED Discharge Orders          Ordered    oxyCODONE (ROXICODONE) 5 MG immediate release tablet  Every 4 hours PRN        03 /02/25 1140  Pricilla Loveless, MD 03/17/23 [phone]

## 2023-03-17 NOTE — ED Triage Notes (Signed)
 Pt here for RUQ pain that started a week ago. HX of gallstones. C/O bilateral shoulder pain. Denies n/v/d/fevers.

## 2023-03-17 NOTE — ED Notes (Signed)
 RN sent down repeat light green tube to lab per labs request

## 2023-03-17 NOTE — ED Notes (Signed)
 Patient transported to Ultrasound

## 2023-03-17 NOTE — Discharge Instructions (Signed)
 Please follow-up with the general surgery office for consideration of an outpatient gallbladder surgery to treat your gallstones.  If you develop worsening, continued, or recurrent abdominal pain, uncontrolled vomiting, fever, chest or back pain, or any other new/concerning symptoms then return to the ER for evaluation.

## 2023-04-18 ENCOUNTER — Emergency Department (HOSPITAL_COMMUNITY)
Admission: EM | Admit: 2023-04-18 | Discharge: 2023-04-19 | Disposition: A | Discharge status: Home / Self Care | Attending: Emergency Medicine | Admitting: Emergency Medicine

## 2023-04-18 ENCOUNTER — Encounter (HOSPITAL_COMMUNITY): Payer: Self-pay | Admitting: Emergency Medicine

## 2023-04-18 ENCOUNTER — Other Ambulatory Visit: Payer: Self-pay

## 2023-04-18 DIAGNOSIS — N898 Other specified noninflammatory disorders of vagina: Secondary | ICD-10-CM | POA: Diagnosis present

## 2023-04-18 DIAGNOSIS — N760 Acute vaginitis: Secondary | ICD-10-CM | POA: Diagnosis not present

## 2023-04-18 LAB — CBC
HCT: 33.4 % — ABNORMAL LOW (ref 36.0–46.0)
Hemoglobin: 11 g/dL — ABNORMAL LOW (ref 12.0–15.0)
MCH: 32.3 pg (ref 26.0–34.0)
MCHC: 32.9 g/dL (ref 30.0–36.0)
MCV: 97.9 fL (ref 80.0–100.0)
Platelets: 232 10*3/uL (ref 150–400)
RBC: 3.41 MIL/uL — ABNORMAL LOW (ref 3.87–5.11)
RDW: 13 % (ref 11.5–15.5)
WBC: 4.1 10*3/uL (ref 4.0–10.5)
nRBC: 0 % (ref 0.0–0.2)

## 2023-04-18 LAB — URINALYSIS, ROUTINE W REFLEX MICROSCOPIC
Bilirubin Urine: NEGATIVE
Glucose, UA: NEGATIVE mg/dL
Ketones, ur: NEGATIVE mg/dL
Nitrite: NEGATIVE
Protein, ur: NEGATIVE mg/dL
Specific Gravity, Urine: 1.02 (ref 1.005–1.030)
pH: 7 (ref 5.0–8.0)

## 2023-04-18 LAB — URINALYSIS, MICROSCOPIC (REFLEX)

## 2023-04-18 LAB — WET PREP, GENITAL
Clue Cells Wet Prep HPF POC: NONE SEEN
Sperm: NONE SEEN
Trich, Wet Prep: NONE SEEN
WBC, Wet Prep HPF POC: <10 (ref <10)
Yeast Wet Prep HPF POC: NONE SEEN

## 2023-04-18 LAB — HIV ANTIBODY (ROUTINE TESTING W REFLEX): HIV Screen 4th Generation wRfx: NONREACTIVE

## 2023-04-18 LAB — HCG, SERUM, QUALITATIVE: Preg, Serum: NEGATIVE

## 2023-04-18 MED ORDER — FLUCONAZOLE 150 MG PO TABS
150.0000 mg | ORAL_TABLET | Freq: Once | ORAL | Status: AC
Start: 2023-04-19 — End: 2023-04-19
  Administered 2023-04-19: 150 mg via ORAL
  Filled 2023-04-18: qty 1

## 2023-04-18 MED ORDER — METRONIDAZOLE 500 MG PO TABS
500.0000 mg | ORAL_TABLET | Freq: Once | ORAL | Status: AC
  Administered 2023-04-19: 500 mg via ORAL
  Filled 2023-04-18: qty 1

## 2023-04-18 NOTE — ED Notes (Signed)
 PA at bedside.

## 2023-04-18 NOTE — ED Provider Notes (Signed)
 [facility] DEPT [facility] Emergency Department Provider Note MRN:  [id number]  Arrival date & time: 04/19/23     Chief Complaint   Vaginal Discharge   History of Present Illness   Kristina Day is a 33 y.o. year-old female presents to the ED with chief complaint of vaginal irritation and dysuria.  Reports onset of symptoms after misusing a clitoral stimulating product and injecting it into the vagina rather than using it externally.  She states that she is having vaginal swelling.  She denies any other associated symptoms.  History provided by patient.   Review of Systems  Pertinent positive and negative review of systems noted in HPI.    Physical Exam   Vitals:   04/18/23 2320 04/19/23 0009  BP: 129/85 131/82  Pulse: 80 75  Resp: 16 15  Temp: 98.1 F (36.7 C) 98.7 F (37.1 C)  SpO2: 100% 100%    CONSTITUTIONAL:  non toxic-appearing, NAD NEURO:  Alert and oriented x 3, CN 3-12 grossly intact EYES:  eyes equal and reactive ENT/NECK:  Supple, no stridor  CARDIO:  normal rate, regular rhythm, appears well-perfused  PULM:  No respiratory distress, CTAB GI/GU:  non-distended, chaperone present during pelvic exam, normal menstrual blood flow, mild soft tissue swelling, no laceration or abscess MSK/SPINE:  No gross deformities, no edema, moves all extremities  SKIN:  no rash, atraumatic   *Additional and/or pertinent findings included in MDM below  Diagnostic and Interventional Summary    EKG Interpretation Date/Time:    Ventricular Rate:    PR Interval:    QRS Duration:    QT Interval:    QTC Calculation:   R Axis:      Text Interpretation:         Labs Reviewed  CBC - Abnormal; Notable for the following components:      Result Value   RBC 3.41 (*)    Hemoglobin 11.0 (*)    HCT 33.4 (*)    All other components within normal limits  URINALYSIS, ROUTINE W REFLEX MICROSCOPIC - Abnormal; Notable for the following components:   Hgb urine  dipstick LARGE (*)    Leukocytes,Ua TRACE (*)    All other components within normal limits  URINALYSIS, MICROSCOPIC (REFLEX) - Abnormal; Notable for the following components:   Bacteria, UA RARE (*)    All other components within normal limits  WET PREP, GENITAL  HCG, SERUM, QUALITATIVE  HIV ANTIBODY (ROUTINE TESTING W REFLEX)  GC/CHLAMYDIA PROBE AMP ([place]) NOT AT [facility]    No orders to display    Medications  metroNIDAZOLE (FLAGYL) tablet 500 mg (500 mg Oral Given 04/19/23 0004)  fluconazole (DIFLUCAN) tablet 150 mg (150 mg Oral Given 04/19/23 0004)     Procedures  /  Critical Care Procedures  ED Course and Medical Decision Making  I have reviewed the triage vital signs, the nursing notes, and pertinent available records from the EMR.  Social Determinants Affecting Complexity of Care: Patient has no clinically significant social determinants affecting this chief complaint..   ED Course: Clinical Course as of 04/19/23 0606  Thu Apr 18, 2023  2212 Patient expresses frustration about her 2 hour wait time.  I apologized and set expectations for how long it would take to get results from this point. [RB]    Clinical Course User Index [RB] Roxy Horseman, PA-C    Medical Decision Making Patient here with vaginal irritation after using a clitoral stimulating product a few days ago.  She  states that she has had some swelling.  External genitalia appear essentially normal.  She is starting her menstrual cycle.  I advised her to discontinue any external or internal creams or lubricants for now.  Cover with Flagyl and Diflucan just in case she has vaginitis that is now obscured with menstrual flow.  Amount and/or Complexity of Data Reviewed Labs: ordered.  Risk Prescription drug management.         Consultants: No consultations were needed in caring for this patient.   Treatment and Plan: Emergency department workup does not suggest an emergent condition requiring  admission or immediate intervention beyond  what has been performed at this time. The patient is safe for discharge and has  been instructed to return immediately for worsening symptoms, change in  symptoms or any other concerns    Final Clinical Impressions(s) / ED Diagnoses     ICD-10-CM   1. Acute vaginitis  N76.0       ED Discharge Orders          Ordered    metroNIDAZOLE (FLAGYL) 500 MG tablet  2 times daily        04/19/23 0000              Discharge Instructions Discussed with and Provided to Patient:     Discharge Instructions      Your tests looked good.  This is likely a reaction to the product you used.  It should resolve on its own, but we will cover you with antibiotic and antifungal just to be safe.  Take medications as prescribed.       Roxy Horseman, PA-C 04/19/23 1610    Wynetta Fines, MD 04/19/23 [phone]

## 2023-04-18 NOTE — ED Triage Notes (Signed)
 Pt from home reports "vaginal discomfort and bleeding."  She states that two days ago "I misused a product that was suppose to go on the outside but I misread it and squirted it in and now it feels like I'm swelling on the inside."  She reports today she started to have some discharge that looked like it was bleeding.

## 2023-04-19 LAB — GC/CHLAMYDIA PROBE AMP (~~LOC~~) NOT AT ARMC
Chlamydia: NEGATIVE
Comment: NEGATIVE
Comment: NORMAL
Neisseria Gonorrhea: NEGATIVE

## 2023-04-19 MED ORDER — METRONIDAZOLE 500 MG PO TABS: 500.0000 mg | ORAL_TABLET | Freq: Two times a day (BID) | ORAL | 0 refills | Status: AC

## 2023-04-19 NOTE — Discharge Instructions (Signed)
 Your tests looked good.  This is likely a reaction to the product you used.  It should resolve on its own, but we will cover you with antibiotic and antifungal just to be safe.  Take medications as prescribed.
